# Patient Record
Sex: Male | Born: 1950
Health system: Southern US, Community
[De-identification: ages and names within clinical notes are randomized; demographics above are authoritative.]

## PROBLEM LIST (undated history)

## (undated) DIAGNOSIS — B1920 Unspecified viral hepatitis C without hepatic coma: Secondary | ICD-10-CM

## (undated) DIAGNOSIS — E119 Type 2 diabetes mellitus without complications: Secondary | ICD-10-CM

## (undated) DIAGNOSIS — K759 Inflammatory liver disease, unspecified: Secondary | ICD-10-CM

## (undated) DIAGNOSIS — I10 Essential (primary) hypertension: Secondary | ICD-10-CM

## (undated) HISTORY — DX: Essential (primary) hypertension: I10

## (undated) HISTORY — DX: Inflammatory liver disease, unspecified: K75.9

## (undated) HISTORY — DX: Unspecified viral hepatitis C without hepatic coma: B19.20

## (undated) HISTORY — PX: HAND SURGERY: SHX662

## (undated) HISTORY — DX: Type 2 diabetes mellitus without complications: E11.9

---

## 2010-01-01 ENCOUNTER — Inpatient Hospital Stay (HOSPITAL_COMMUNITY): Admission: EM | Admit: 2010-01-01 | Discharge: 2010-01-02 | Payer: Self-pay | Admitting: Emergency Medicine

## 2010-06-13 LAB — GLUCOSE, CAPILLARY
Glucose-Capillary: 104 mg/dL — ABNORMAL HIGH (ref 70–99)
Glucose-Capillary: 98 mg/dL (ref 70–99)

## 2010-06-13 LAB — CROSSMATCH

## 2010-06-13 LAB — POCT I-STAT 4, (NA,K, GLUC, HGB,HCT)
Glucose, Bld: 123 mg/dL — ABNORMAL HIGH (ref 70–99)
HCT: 22 % — ABNORMAL LOW (ref 39.0–52.0)
Hemoglobin: 7.5 g/dL — ABNORMAL LOW (ref 13.0–17.0)
Sodium: 139 mEq/L (ref 135–145)

## 2010-06-13 LAB — COMPREHENSIVE METABOLIC PANEL
ALT: 47 U/L (ref 0–53)
Albumin: 2.8 g/dL — ABNORMAL LOW (ref 3.5–5.2)
Alkaline Phosphatase: 41 U/L (ref 39–117)
Calcium: 8.1 mg/dL — ABNORMAL LOW (ref 8.4–10.5)
GFR calc Af Amer: >60 mL/min (ref >60)
Potassium: 3.9 mEq/L (ref 3.5–5.1)
Sodium: 136 mEq/L (ref 135–145)
Total Protein: 5.6 g/dL — ABNORMAL LOW (ref 6.0–8.3)

## 2010-06-13 LAB — BASIC METABOLIC PANEL
Chloride: 104 mEq/L (ref 96–112)
Creatinine, Ser: 1.05 mg/dL (ref 0.4–1.5)
GFR calc Af Amer: >60 mL/min (ref >60)
GFR calc non Af Amer: >60 mL/min (ref >60)
Potassium: 3.5 mEq/L (ref 3.5–5.1)

## 2010-06-13 LAB — CBC
HCT: 38.9 % — ABNORMAL LOW (ref 39.0–52.0)
MCV: 94.6 fL (ref 78.0–100.0)
Platelets: 211 10*3/uL (ref 150–400)
RBC: 4.11 MIL/uL — ABNORMAL LOW (ref 4.22–5.81)
WBC: 6.8 10*3/uL (ref 4.0–10.5)

## 2010-06-13 LAB — ABO/RH: ABO/RH(D): O POS

## 2010-06-13 LAB — DIFFERENTIAL
Eosinophils Relative: 2 % (ref 0–5)
Lymphocytes Relative: 42 % (ref 12–46)
Lymphs Abs: 2.9 10*3/uL (ref 0.7–4.0)
Neutro Abs: 3 10*3/uL (ref 1.7–7.7)

## 2010-06-13 LAB — HEMOGLOBIN AND HEMATOCRIT, BLOOD: HCT: 28 % — ABNORMAL LOW (ref 39.0–52.0)

## 2010-06-13 LAB — MRSA PCR SCREENING: MRSA by PCR: NEGATIVE

## 2011-02-17 ENCOUNTER — Encounter (INDEPENDENT_AMBULATORY_CARE_PROVIDER_SITE_OTHER): Payer: Self-pay | Admitting: Ophthalmology

## 2011-03-05 ENCOUNTER — Encounter (INDEPENDENT_AMBULATORY_CARE_PROVIDER_SITE_OTHER): Payer: PRIVATE HEALTH INSURANCE | Admitting: Ophthalmology

## 2011-03-05 DIAGNOSIS — I10 Essential (primary) hypertension: Secondary | ICD-10-CM

## 2011-03-05 DIAGNOSIS — H35039 Hypertensive retinopathy, unspecified eye: Secondary | ICD-10-CM

## 2011-03-05 DIAGNOSIS — E1139 Type 2 diabetes mellitus with other diabetic ophthalmic complication: Secondary | ICD-10-CM

## 2011-03-05 DIAGNOSIS — H3410 Central retinal artery occlusion, unspecified eye: Secondary | ICD-10-CM

## 2011-04-04 ENCOUNTER — Ambulatory Visit (INDEPENDENT_AMBULATORY_CARE_PROVIDER_SITE_OTHER): Payer: PRIVATE HEALTH INSURANCE | Admitting: Ophthalmology

## 2011-04-04 DIAGNOSIS — H35059 Retinal neovascularization, unspecified, unspecified eye: Secondary | ICD-10-CM

## 2011-04-15 ENCOUNTER — Other Ambulatory Visit: Payer: Self-pay | Admitting: Family Medicine

## 2011-04-15 DIAGNOSIS — H5461 Unqualified visual loss, right eye, normal vision left eye: Secondary | ICD-10-CM

## 2011-04-15 DIAGNOSIS — I10 Essential (primary) hypertension: Secondary | ICD-10-CM

## 2011-04-21 ENCOUNTER — Other Ambulatory Visit: Payer: PRIVATE HEALTH INSURANCE

## 2011-04-25 ENCOUNTER — Ambulatory Visit
Admission: RE | Admit: 2011-04-25 | Discharge: 2011-04-25 | Disposition: A | Discharge status: Home / Self Care | Payer: PRIVATE HEALTH INSURANCE | Source: Ambulatory Visit | Attending: Family Medicine | Admitting: Family Medicine

## 2011-04-25 DIAGNOSIS — H5461 Unqualified visual loss, right eye, normal vision left eye: Secondary | ICD-10-CM

## 2011-04-25 DIAGNOSIS — I10 Essential (primary) hypertension: Secondary | ICD-10-CM

## 2011-08-04 ENCOUNTER — Ambulatory Visit (INDEPENDENT_AMBULATORY_CARE_PROVIDER_SITE_OTHER): Payer: PRIVATE HEALTH INSURANCE | Admitting: Ophthalmology

## 2011-08-04 DIAGNOSIS — H35039 Hypertensive retinopathy, unspecified eye: Secondary | ICD-10-CM

## 2011-08-04 DIAGNOSIS — H3410 Central retinal artery occlusion, unspecified eye: Secondary | ICD-10-CM

## 2011-08-04 DIAGNOSIS — E11319 Type 2 diabetes mellitus with unspecified diabetic retinopathy without macular edema: Secondary | ICD-10-CM

## 2011-08-04 DIAGNOSIS — I10 Essential (primary) hypertension: Secondary | ICD-10-CM

## 2011-08-04 DIAGNOSIS — E1139 Type 2 diabetes mellitus with other diabetic ophthalmic complication: Secondary | ICD-10-CM

## 2011-08-04 DIAGNOSIS — H2510 Age-related nuclear cataract, unspecified eye: Secondary | ICD-10-CM

## 2011-08-04 DIAGNOSIS — H43819 Vitreous degeneration, unspecified eye: Secondary | ICD-10-CM

## 2012-02-04 ENCOUNTER — Ambulatory Visit (INDEPENDENT_AMBULATORY_CARE_PROVIDER_SITE_OTHER): Payer: PRIVATE HEALTH INSURANCE | Admitting: Ophthalmology

## 2012-03-17 ENCOUNTER — Ambulatory Visit (INDEPENDENT_AMBULATORY_CARE_PROVIDER_SITE_OTHER): Payer: PRIVATE HEALTH INSURANCE | Admitting: Ophthalmology

## 2012-03-17 DIAGNOSIS — H35039 Hypertensive retinopathy, unspecified eye: Secondary | ICD-10-CM

## 2012-03-17 DIAGNOSIS — E11319 Type 2 diabetes mellitus with unspecified diabetic retinopathy without macular edema: Secondary | ICD-10-CM

## 2012-03-17 DIAGNOSIS — H43819 Vitreous degeneration, unspecified eye: Secondary | ICD-10-CM

## 2012-03-17 DIAGNOSIS — I10 Essential (primary) hypertension: Secondary | ICD-10-CM

## 2012-03-17 DIAGNOSIS — H348192 Central retinal vein occlusion, unspecified eye, stable: Secondary | ICD-10-CM

## 2012-03-17 DIAGNOSIS — E1139 Type 2 diabetes mellitus with other diabetic ophthalmic complication: Secondary | ICD-10-CM

## 2012-03-17 DIAGNOSIS — H2510 Age-related nuclear cataract, unspecified eye: Secondary | ICD-10-CM

## 2012-12-24 ENCOUNTER — Ambulatory Visit (INDEPENDENT_AMBULATORY_CARE_PROVIDER_SITE_OTHER): Payer: PRIVATE HEALTH INSURANCE | Admitting: Ophthalmology

## 2013-01-07 ENCOUNTER — Ambulatory Visit (INDEPENDENT_AMBULATORY_CARE_PROVIDER_SITE_OTHER): Payer: PRIVATE HEALTH INSURANCE | Admitting: Ophthalmology

## 2013-01-07 DIAGNOSIS — H348192 Central retinal vein occlusion, unspecified eye, stable: Secondary | ICD-10-CM

## 2013-01-07 DIAGNOSIS — H43819 Vitreous degeneration, unspecified eye: Secondary | ICD-10-CM

## 2013-01-07 DIAGNOSIS — I10 Essential (primary) hypertension: Secondary | ICD-10-CM

## 2013-01-07 DIAGNOSIS — H35039 Hypertensive retinopathy, unspecified eye: Secondary | ICD-10-CM

## 2013-01-07 DIAGNOSIS — H2510 Age-related nuclear cataract, unspecified eye: Secondary | ICD-10-CM

## 2013-01-27 ENCOUNTER — Other Ambulatory Visit (INDEPENDENT_AMBULATORY_CARE_PROVIDER_SITE_OTHER): Payer: PRIVATE HEALTH INSURANCE | Admitting: Ophthalmology

## 2013-01-27 DIAGNOSIS — H348192 Central retinal vein occlusion, unspecified eye, stable: Secondary | ICD-10-CM

## 2013-01-27 DIAGNOSIS — I10 Essential (primary) hypertension: Secondary | ICD-10-CM

## 2013-01-27 DIAGNOSIS — H35039 Hypertensive retinopathy, unspecified eye: Secondary | ICD-10-CM

## 2013-06-10 ENCOUNTER — Ambulatory Visit (INDEPENDENT_AMBULATORY_CARE_PROVIDER_SITE_OTHER): Payer: PRIVATE HEALTH INSURANCE | Admitting: Ophthalmology

## 2013-06-10 DIAGNOSIS — H35039 Hypertensive retinopathy, unspecified eye: Secondary | ICD-10-CM

## 2013-06-10 DIAGNOSIS — H348192 Central retinal vein occlusion, unspecified eye, stable: Secondary | ICD-10-CM

## 2013-06-10 DIAGNOSIS — H43819 Vitreous degeneration, unspecified eye: Secondary | ICD-10-CM

## 2013-06-10 DIAGNOSIS — H2510 Age-related nuclear cataract, unspecified eye: Secondary | ICD-10-CM

## 2013-06-10 DIAGNOSIS — I10 Essential (primary) hypertension: Secondary | ICD-10-CM

## 2014-01-29 DIAGNOSIS — B1920 Unspecified viral hepatitis C without hepatic coma: Secondary | ICD-10-CM

## 2014-01-29 HISTORY — DX: Unspecified viral hepatitis C without hepatic coma: B19.20

## 2014-03-13 ENCOUNTER — Other Ambulatory Visit: Payer: Self-pay | Admitting: Nurse Practitioner

## 2014-03-13 DIAGNOSIS — R772 Abnormality of alphafetoprotein: Secondary | ICD-10-CM

## 2014-03-17 ENCOUNTER — Ambulatory Visit (INDEPENDENT_AMBULATORY_CARE_PROVIDER_SITE_OTHER): Payer: PRIVATE HEALTH INSURANCE | Admitting: Ophthalmology

## 2014-03-17 DIAGNOSIS — E11329 Type 2 diabetes mellitus with mild nonproliferative diabetic retinopathy without macular edema: Secondary | ICD-10-CM

## 2014-03-17 DIAGNOSIS — I10 Essential (primary) hypertension: Secondary | ICD-10-CM

## 2014-03-17 DIAGNOSIS — H43813 Vitreous degeneration, bilateral: Secondary | ICD-10-CM

## 2014-03-17 DIAGNOSIS — E11319 Type 2 diabetes mellitus with unspecified diabetic retinopathy without macular edema: Secondary | ICD-10-CM

## 2014-03-17 DIAGNOSIS — H35033 Hypertensive retinopathy, bilateral: Secondary | ICD-10-CM

## 2014-03-17 DIAGNOSIS — H34811 Central retinal vein occlusion, right eye: Secondary | ICD-10-CM

## 2014-03-20 ENCOUNTER — Encounter: Payer: Self-pay | Admitting: Gastroenterology

## 2014-04-04 ENCOUNTER — Ambulatory Visit
Admission: RE | Admit: 2014-04-04 | Discharge: 2014-04-04 | Disposition: A | Discharge status: Home / Self Care | Payer: PRIVATE HEALTH INSURANCE | Source: Ambulatory Visit | Attending: Nurse Practitioner | Admitting: Nurse Practitioner

## 2014-04-04 DIAGNOSIS — R772 Abnormality of alphafetoprotein: Secondary | ICD-10-CM

## 2014-04-04 DIAGNOSIS — B1920 Unspecified viral hepatitis C without hepatic coma: Secondary | ICD-10-CM | POA: Diagnosis not present

## 2014-05-02 DIAGNOSIS — B182 Chronic viral hepatitis C: Secondary | ICD-10-CM | POA: Diagnosis not present

## 2014-05-02 DIAGNOSIS — K7469 Other cirrhosis of liver: Secondary | ICD-10-CM | POA: Diagnosis not present

## 2014-05-08 ENCOUNTER — Encounter: Payer: Self-pay | Admitting: Gastroenterology

## 2014-05-08 ENCOUNTER — Ambulatory Visit (INDEPENDENT_AMBULATORY_CARE_PROVIDER_SITE_OTHER): Payer: PRIVATE HEALTH INSURANCE | Admitting: Gastroenterology

## 2014-05-08 VITALS — BP 128/80 | HR 84 | Ht 73.0 in | Wt 178.6 lb

## 2014-05-08 DIAGNOSIS — B182 Chronic viral hepatitis C: Secondary | ICD-10-CM

## 2014-05-08 DIAGNOSIS — Z1211 Encounter for screening for malignant neoplasm of colon: Secondary | ICD-10-CM

## 2014-05-08 MED ORDER — PEG-KCL-NACL-NASULF-NA ASC-C 100 G PO SOLR: 1.0000 | Freq: Once | ORAL | Status: DC

## 2014-05-08 NOTE — Progress Notes (Signed)
HPI: This is a  very pleasant 64 year old man whom I am meeting for the first time today. He is here with his wife today.  Currently week 6-7 of harvoni.  According to notes scanned into [facility], he was recently started on Harvoni for genotype 1a chronic Hep C.  Had a F4 fibrosure test result, that is very suspicious for underlying severe fibrosis, cirrhosis and so underwent US for hepatoma screening and he was recommended to have EGD to screen for varices.  03/2014 [place]: liver appeared normal, no morphologic changes of cirrhosis  Quit drinking etoh for 5 months now.    No overt gi bleeding.  Overall weight up a bit.  Never had colon cancer screening.    Review of systems: Pertinent positive and negative review of systems were noted in the above HPI section. Complete review of systems was performed and was otherwise normal.    Past Medical History  Diagnosis Date  . Hepatitis   . Hypertension   . Diabetes     Past Surgical History  Procedure Laterality Date  . Hand surgery      rt thumb    Current Outpatient Prescriptions  Medication Sig Dispense Refill  . amLODipine (NORVASC) 10 MG tablet Take 10 mg by mouth daily.  3  . aspirin 81 MG tablet Take 81 mg by mouth daily.    . fluticasone (CUTIVATE) 0.05 % cream   3  . losartan (COZAAR) 100 MG tablet Take 100 mg by mouth daily.  4  . metFORMIN (GLUCOPHAGE) 500 MG tablet Take 500 mg by mouth daily.  3  . Multiple Vitamin (MULTIVITAMIN) tablet Take 1 tablet by mouth daily.    . Omega-3 Fatty Acids (FISH OIL) 1000 MG CAPS Take 1 capsule by mouth 2 (two) times a week.    Marland Kitchen VASCEPA 1 G CAPS Take 1 capsule by mouth daily.  3   No current facility-administered medications for this visit.    Allergies as of 05/08/2014  . (No Known Allergies)    Family History  Problem Relation Age of Onset  . Diabetes Paternal Grandmother     History   Social History  . Marital Status: Married    Spouse Name: N/A    Number of  Children: 2  . Years of Education: N/A   Occupational History  . [employer]    Social History Main Topics  . Smoking status: Current Every Day Smoker    Types: Cigarettes  . Smokeless tobacco: Never Used  . Alcohol Use: No  . Drug Use: No  . Sexual Activity: Not on file   Other Topics Concern  . Not on file   Social History Narrative  . No narrative on file       Physical Exam: BP 128/80 mmHg  Pulse 84  Ht 6\' 1"  (1.854 m)  Wt 178 lb 9.6 oz (81.012 kg)  BMI 23.57 kg/m2 Constitutional: generally well-appearing Psychiatric: alert and oriented x3 Eyes: extraocular movements intact Mouth: oral pharynx moist, no lesions Neck: supple no lymphadenopathy Cardiovascular: heart regular rate and rhythm Lungs: clear to auscultation bilaterally Abdomen: soft, nontender, nondistended, no obvious ascites, no peritoneal signs, normal bowel sounds Extremities: no lower extremity edema bilaterally Skin: no lesions on visible extremities    Assessment and plan: 64 y.o. male with chronic hepatitis C, elevated fibrosis score suggesting underlying cirrhosis, routine risk for colon cancer  He has normal coags normal platelets and so I think it is unlikely that he has significant portal  hypertension however given his elevated fibrosis score he was referred by his hepatologist for EGD to screen for esophageal varices. I'm happy to do that for him. At the same time he will undergo colonoscopy for colon cancer screening since he has never had screening that he is aware of. I see no reason for any further blood tests or imaging studies prior to then.

## 2014-05-08 NOTE — Patient Instructions (Signed)
You will be set up for a colonoscopy for colon cancer screening. You will be set up for an upper endoscopy for esophageal varices screening.

## 2014-06-05 ENCOUNTER — Encounter: Payer: Self-pay | Admitting: Gastroenterology

## 2014-06-16 ENCOUNTER — Ambulatory Visit (AMBULATORY_SURGERY_CENTER): Payer: PRIVATE HEALTH INSURANCE | Admitting: Gastroenterology

## 2014-06-16 ENCOUNTER — Encounter: Payer: Self-pay | Admitting: Gastroenterology

## 2014-06-16 VITALS — BP 132/70 | HR 67 | Temp 97.2°F | Resp 21 | Ht 73.0 in | Wt 178.0 lb

## 2014-06-16 DIAGNOSIS — K2990 Gastroduodenitis, unspecified, without bleeding: Secondary | ICD-10-CM

## 2014-06-16 DIAGNOSIS — E119 Type 2 diabetes mellitus without complications: Secondary | ICD-10-CM | POA: Diagnosis not present

## 2014-06-16 DIAGNOSIS — B182 Chronic viral hepatitis C: Secondary | ICD-10-CM

## 2014-06-16 DIAGNOSIS — Z1211 Encounter for screening for malignant neoplasm of colon: Secondary | ICD-10-CM | POA: Diagnosis not present

## 2014-06-16 DIAGNOSIS — K2970 Gastritis, unspecified, without bleeding: Secondary | ICD-10-CM

## 2014-06-16 DIAGNOSIS — D125 Benign neoplasm of sigmoid colon: Secondary | ICD-10-CM

## 2014-06-16 DIAGNOSIS — B1920 Unspecified viral hepatitis C without hepatic coma: Secondary | ICD-10-CM | POA: Diagnosis not present

## 2014-06-16 DIAGNOSIS — I10 Essential (primary) hypertension: Secondary | ICD-10-CM | POA: Diagnosis not present

## 2014-06-16 LAB — GLUCOSE, CAPILLARY
GLUCOSE-CAPILLARY: 96 mg/dL (ref 70–99)
Glucose-Capillary: 93 mg/dL (ref 70–99)

## 2014-06-16 MED ORDER — SODIUM CHLORIDE 0.9 % IV SOLN: 500.0000 mL | INTRAVENOUS | Status: DC

## 2014-06-16 NOTE — Progress Notes (Signed)
Called to room to assist during endoscopic procedure.  Patient ID and intended procedure confirmed with present staff. Received instructions for my participation in the procedure from the performing physician.  

## 2014-06-16 NOTE — Op Note (Signed)
[facility]  [address]. [place], [id number]   COLONOSCOPY PROCEDURE REPORT  PATIENT: Charles Saunders, Charles Saunders  MR#: [id number] BIRTHDATE: 05/11/1950 , 79  yrs. old GENDER: male ENDOSCOPIST: Milus Banister, MD REFERRED BY: Roosevelt Locks, NP PROCEDURE DATE:  06/16/2014 PROCEDURE:   Colonoscopy, screening and Colonoscopy with snare polypectomy First Screening Colonoscopy - Avg.  risk and is 50 yrs.  old or older Yes.  Prior Negative Screening - Now for repeat screening. N/A  History of Adenoma - Now for follow-up colonoscopy & has been > or = to 3 yrs.  N/A ASA CLASS:   Class III INDICATIONS:Screening for colonic neoplasia and Colorectal Neoplasm Risk Assessment for this procedure is average risk. MEDICATIONS: Monitored anesthesia care and Propofol 200 mg IV  DESCRIPTION OF PROCEDURE:   After the risks benefits and alternatives of the procedure were thoroughly explained, informed consent was obtained.  The digital rectal exam revealed no abnormalities of the rectum.   The LB [id number]  endoscope was introduced through the anus and advanced to the cecum, which was identified by both the appendix and ileocecal valve. No adverse events experienced.   The quality of the prep was excellent.  The instrument was then slowly withdrawn as the colon was fully examined.   COLON FINDINGS: A sessile polyp measuring 4 mm in size was found in the sigmoid colon.  A polypectomy was performed with a cold snare. The resection was complete, the polyp tissue was completely retrieved and sent to histology.   There was mild diverticulosis noted in the left colon.   The examination was otherwise normal. Retroflexed views revealed no abnormalities. The time to cecum = 2.1 Withdrawal time = 7.9   The scope was withdrawn and the procedure completed. COMPLICATIONS: There were no immediate complications.  ENDOSCOPIC IMPRESSION: 1.   Sessile polyp was found in the sigmoid colon;  polypectomy was performed with a cold snare 2.   Mild diverticulosis was noted in the left colon 3.   The examination was otherwise normal  RECOMMENDATIONS: If the polyp(s) removed today are proven to be adenomatous (pre-cancerous) polyps, you will need a repeat colonoscopy in 5 years.  Otherwise you should continue to follow colorectal cancer screening guidelines for "routine risk" patients with colonoscopy in 10 years.  You will receive a letter within 1-2 weeks with the results of your biopsy as well as final recommendations.  Please call my office if you have not received a letter after 3 weeks.  eSigned:  Milus Banister, MD 06/16/2014 3:13 PM

## 2014-06-16 NOTE — Progress Notes (Signed)
Report to PACU, RN, vss, BBS= Clear.  

## 2014-06-16 NOTE — Op Note (Signed)
[facility]  [address]. [place], [id number]   ENDOSCOPY PROCEDURE REPORT  PATIENT: Charles Saunders, Charles Saunders  MR#: [id number] BIRTHDATE: 05/22/1950 , 42  yrs. old GENDER: male ENDOSCOPIST: Milus Banister, MD PROCEDURE DATE:  06/16/2014 PROCEDURE:  EGD w/ biopsy ASA CLASS:     Class III INDICATIONS:  chronic liver disease, recent fibrosure test suggesting underlying fibrosis/cirrhosis; screening for varices. MEDICATIONS: Monitored anesthesia care and Propofol 100 mg IV TOPICAL ANESTHETIC: none  DESCRIPTION OF PROCEDURE: After the risks benefits and alternatives of the procedure were thoroughly explained, informed consent was obtained.  The LB [id number] endoscope was introduced through the mouth and advanced to the second portion of the duodenum , Without limitations.  The instrument was slowly withdrawn as the mucosa was fully examined.  There was mild to moderate, non-specific pangastritis.  The distal stomach was biopsied and sent to patology.  There were no changes to suggest portal hypertension (no esophageal or gastric varices, no portal hypertensive appearing gastropathy).  The examination was otherwise normal.  Retroflexed views revealed no abnormalities. The scope was then withdrawn from the patient and the procedure completed.  COMPLICATIONS: There were no immediate complications.  ENDOSCOPIC IMPRESSION: There was mild to moderate, non-specific pangastritis.  The distal stomach was biopsied and sent to patology.  There were no changes to suggest portal hypertension (no esophageal or gastric varices, no portal hypertensive appearing gastropathy).  The examination was otherwise normal  RECOMMENDATIONS: Await final pathology results.   eSigned:  Milus Banister, MD 06/16/2014 3:17 PM    CC: Roosevelt Locks, NP

## 2014-06-16 NOTE — Patient Instructions (Addendum)
One of your biggest health concerns is your smoking.  This increases your risk for most cancers and serious cardiovascular diseases such as strokes, heart attacks.  You should try your best to stop.  If you need assistance, please contact your PCP or Smoking Cessation Class at [facility] [phone]) or [place] (1-800-QUIT-NOW).   YOU HAD AN ENDOSCOPIC PROCEDURE TODAY AT [facility]:   Refer to the procedure report that was given to you for any specific questions about what was found during the examination.  If the procedure report does not answer your questions, please call your gastroenterologist to clarify.  If you requested that your care partner not be given the details of your procedure findings, then the procedure report has been included in a sealed envelope for you to review at your convenience later.  YOU SHOULD EXPECT: Some feelings of bloating in the abdomen. Passage of more gas than usual.  Walking can help get rid of the air that was put into your GI tract during the procedure and reduce the bloating. If you had a lower endoscopy (such as a colonoscopy or flexible sigmoidoscopy) you may notice spotting of blood in your stool or on the toilet paper. If you underwent a bowel prep for your procedure, you may not have a normal bowel movement for a few days.  Please Note:  You might notice some irritation and congestion in your nose or some drainage.  This is from the oxygen used during your procedure.  There is no need for concern and it should clear up in a day or so.  SYMPTOMS TO REPORT IMMEDIATELY:   Following lower endoscopy (colonoscopy or flexible sigmoidoscopy):  Excessive amounts of blood in the stool  Significant tenderness or worsening of abdominal pains  Swelling of the abdomen that is new, acute  Fever of 100F or higher   Following upper endoscopy (EGD)  Vomiting of blood or coffee ground material  New chest pain or pain under the  shoulder blades  Painful or persistently difficult swallowing  New shortness of breath  Fever of 100F or higher  Black, tarry-looking stools  For urgent or emergent issues, a gastroenterologist can be reached at any hour by calling [phone].   DIET: Your first meal following the procedure should be a small meal and then it is ok to progress to your normal diet. Heavy or fried foods are harder to digest and may make you feel nauseous or bloated.  Likewise, meals heavy in dairy and vegetables can increase bloating.  Drink plenty of fluids but you should avoid alcoholic beverages for 24 hours.  ACTIVITY:  You should plan to take it easy for the rest of today and you should NOT DRIVE or use heavy machinery until tomorrow (because of the sedation medicines used during the test).    FOLLOW UP: Our staff will call the number listed on your records the next business day following your procedure to check on you and address any questions or concerns that you may have regarding the information given to you following your procedure. If we do not reach you, we will leave a message.  However, if you are feeling well and you are not experiencing any problems, there is no need to return our call.  We will assume that you have returned to your regular daily activities without incident.  If any biopsies were taken you will be contacted by phone or by letter within the next 1-3 weeks.  Please  call us at [phone] if you have not heard about the biopsies in 3 weeks.    SIGNATURES/CONFIDENTIALITY: You and/or your care partner have signed paperwork which will be entered into your electronic medical record.  These signatures attest to the fact that that the information above on your After Visit Summary has been reviewed and is understood.  Full responsibility of the confidentiality of this discharge information lies with you and/or your care-partner.  Await pathology  Please read over handouts about  gastritis, high fiber diets, diverticulosis and polyps  Continue your normal medications

## 2014-06-19 ENCOUNTER — Encounter: Payer: Self-pay | Admitting: *Deleted

## 2014-06-19 ENCOUNTER — Telehealth: Payer: Self-pay | Admitting: *Deleted

## 2014-06-19 NOTE — Telephone Encounter (Signed)
  Follow up Call-  Call back number 06/16/2014  Post procedure Call Back phone  # [phone] or [phone]  Permission to leave phone message Yes      no answer, left message.

## 2014-06-21 DIAGNOSIS — B182 Chronic viral hepatitis C: Secondary | ICD-10-CM | POA: Diagnosis not present

## 2014-06-21 DIAGNOSIS — K7469 Other cirrhosis of liver: Secondary | ICD-10-CM | POA: Diagnosis not present

## 2014-07-03 ENCOUNTER — Encounter: Payer: Self-pay | Admitting: Gastroenterology

## 2014-07-06 DIAGNOSIS — E0800 Diabetes mellitus due to underlying condition with hyperosmolarity without nonketotic hyperglycemic-hyperosmolar coma (NKHHC): Secondary | ICD-10-CM | POA: Diagnosis not present

## 2014-07-06 DIAGNOSIS — I10 Essential (primary) hypertension: Secondary | ICD-10-CM | POA: Diagnosis not present

## 2014-07-06 DIAGNOSIS — L2089 Other atopic dermatitis: Secondary | ICD-10-CM | POA: Diagnosis not present

## 2014-07-06 DIAGNOSIS — F5221 Male erectile disorder: Secondary | ICD-10-CM | POA: Diagnosis not present

## 2014-07-11 ENCOUNTER — Telehealth: Payer: Self-pay

## 2014-07-11 NOTE — Telephone Encounter (Signed)
Left message on machine to call back  

## 2014-07-11 NOTE — Telephone Encounter (Signed)
-----   Message from Milus Banister, MD sent at 07/09/2014  7:06 AM EDT ----- Chong Sicilian, Can you call him and let him know that he will be getting a letter about the colonoscopy biopsy and that i forgot to mention the stomach biopsy on that letter.  The stomach biopsy shows no sign of infection or cancer.  Thanks  Santiago Glad, thanks for reminder.  dj  ----- Message -----    From: Kimber Relic, RN    Sent: 07/07/2014   2:26 PM      To: Milus Banister, MD  Dr. Ardis Hughs, This patient had a double procedure, no mention of EGD results on his letter. Thanks, Santiago Glad

## 2014-07-11 NOTE — Telephone Encounter (Signed)
The patient has been notified of this information and all questions answered.

## 2014-12-07 ENCOUNTER — Other Ambulatory Visit: Payer: Self-pay | Admitting: Nurse Practitioner

## 2014-12-07 DIAGNOSIS — C220 Liver cell carcinoma: Secondary | ICD-10-CM

## 2014-12-07 DIAGNOSIS — K7460 Unspecified cirrhosis of liver: Secondary | ICD-10-CM

## 2014-12-18 ENCOUNTER — Ambulatory Visit
Admission: RE | Admit: 2014-12-18 | Discharge: 2014-12-18 | Disposition: A | Discharge status: Home / Self Care | Payer: Medicare HMO | Source: Ambulatory Visit | Attending: Nurse Practitioner | Admitting: Nurse Practitioner

## 2014-12-18 ENCOUNTER — Ambulatory Visit (INDEPENDENT_AMBULATORY_CARE_PROVIDER_SITE_OTHER): Payer: PRIVATE HEALTH INSURANCE | Admitting: Ophthalmology

## 2014-12-18 DIAGNOSIS — K7460 Unspecified cirrhosis of liver: Secondary | ICD-10-CM

## 2014-12-18 DIAGNOSIS — C220 Liver cell carcinoma: Secondary | ICD-10-CM

## 2015-01-03 ENCOUNTER — Ambulatory Visit (INDEPENDENT_AMBULATORY_CARE_PROVIDER_SITE_OTHER): Payer: Medicare HMO | Admitting: Ophthalmology

## 2015-01-03 DIAGNOSIS — H348111 Central retinal vein occlusion, right eye, with retinal neovascularization: Secondary | ICD-10-CM

## 2015-01-03 DIAGNOSIS — E113292 Type 2 diabetes mellitus with mild nonproliferative diabetic retinopathy without macular edema, left eye: Secondary | ICD-10-CM

## 2015-01-03 DIAGNOSIS — E11311 Type 2 diabetes mellitus with unspecified diabetic retinopathy with macular edema: Secondary | ICD-10-CM

## 2015-01-03 DIAGNOSIS — I10 Essential (primary) hypertension: Secondary | ICD-10-CM

## 2015-01-03 DIAGNOSIS — H33021 Retinal detachment with multiple breaks, right eye: Secondary | ICD-10-CM | POA: Diagnosis not present

## 2015-01-03 DIAGNOSIS — H43813 Vitreous degeneration, bilateral: Secondary | ICD-10-CM

## 2015-01-03 DIAGNOSIS — H2513 Age-related nuclear cataract, bilateral: Secondary | ICD-10-CM

## 2015-01-03 DIAGNOSIS — H35033 Hypertensive retinopathy, bilateral: Secondary | ICD-10-CM

## 2015-01-18 ENCOUNTER — Ambulatory Visit (INDEPENDENT_AMBULATORY_CARE_PROVIDER_SITE_OTHER): Payer: Medicare HMO | Admitting: Ophthalmology

## 2015-01-18 DIAGNOSIS — H33301 Unspecified retinal break, right eye: Secondary | ICD-10-CM

## 2015-05-21 ENCOUNTER — Ambulatory Visit (INDEPENDENT_AMBULATORY_CARE_PROVIDER_SITE_OTHER): Payer: Medicare HMO | Admitting: Ophthalmology

## 2015-05-23 ENCOUNTER — Ambulatory Visit (INDEPENDENT_AMBULATORY_CARE_PROVIDER_SITE_OTHER): Payer: Medicare HMO | Admitting: Ophthalmology

## 2015-06-06 ENCOUNTER — Ambulatory Visit (INDEPENDENT_AMBULATORY_CARE_PROVIDER_SITE_OTHER): Payer: Commercial Managed Care - HMO | Admitting: Ophthalmology

## 2015-06-06 DIAGNOSIS — I10 Essential (primary) hypertension: Secondary | ICD-10-CM | POA: Diagnosis not present

## 2015-06-06 DIAGNOSIS — H348112 Central retinal vein occlusion, right eye, stable: Secondary | ICD-10-CM | POA: Diagnosis not present

## 2015-06-06 DIAGNOSIS — H33301 Unspecified retinal break, right eye: Secondary | ICD-10-CM | POA: Diagnosis not present

## 2015-06-06 DIAGNOSIS — H43813 Vitreous degeneration, bilateral: Secondary | ICD-10-CM

## 2015-06-06 DIAGNOSIS — H35033 Hypertensive retinopathy, bilateral: Secondary | ICD-10-CM

## 2015-07-10 DIAGNOSIS — I10 Essential (primary) hypertension: Secondary | ICD-10-CM | POA: Diagnosis not present

## 2015-07-10 DIAGNOSIS — E782 Mixed hyperlipidemia: Secondary | ICD-10-CM | POA: Diagnosis not present

## 2015-07-10 DIAGNOSIS — E0800 Diabetes mellitus due to underlying condition with hyperosmolarity without nonketotic hyperglycemic-hyperosmolar coma (NKHHC): Secondary | ICD-10-CM | POA: Diagnosis not present

## 2015-07-10 DIAGNOSIS — B182 Chronic viral hepatitis C: Secondary | ICD-10-CM | POA: Diagnosis not present

## 2015-07-12 DIAGNOSIS — E089 Diabetes mellitus due to underlying condition without complications: Secondary | ICD-10-CM | POA: Diagnosis not present

## 2015-07-12 DIAGNOSIS — F1021 Alcohol dependence, in remission: Secondary | ICD-10-CM | POA: Diagnosis not present

## 2015-07-12 DIAGNOSIS — I10 Essential (primary) hypertension: Secondary | ICD-10-CM | POA: Diagnosis not present

## 2015-08-08 ENCOUNTER — Other Ambulatory Visit: Payer: Self-pay | Admitting: Nurse Practitioner

## 2015-08-08 DIAGNOSIS — K7469 Other cirrhosis of liver: Secondary | ICD-10-CM | POA: Diagnosis not present

## 2015-08-09 DIAGNOSIS — K7469 Other cirrhosis of liver: Secondary | ICD-10-CM | POA: Diagnosis not present

## 2015-08-14 DIAGNOSIS — E089 Diabetes mellitus due to underlying condition without complications: Secondary | ICD-10-CM | POA: Diagnosis not present

## 2015-08-14 DIAGNOSIS — F5221 Male erectile disorder: Secondary | ICD-10-CM | POA: Diagnosis not present

## 2015-08-14 DIAGNOSIS — I10 Essential (primary) hypertension: Secondary | ICD-10-CM | POA: Diagnosis not present

## 2015-08-17 ENCOUNTER — Other Ambulatory Visit: Payer: Medicare HMO

## 2015-08-24 ENCOUNTER — Ambulatory Visit
Admission: RE | Admit: 2015-08-24 | Discharge: 2015-08-24 | Disposition: A | Discharge status: Home / Self Care | Payer: Commercial Managed Care - HMO | Source: Ambulatory Visit | Attending: Nurse Practitioner | Admitting: Nurse Practitioner

## 2015-08-24 DIAGNOSIS — Z1389 Encounter for screening for other disorder: Secondary | ICD-10-CM | POA: Diagnosis not present

## 2015-08-24 DIAGNOSIS — K7469 Other cirrhosis of liver: Secondary | ICD-10-CM

## 2015-10-02 IMAGING — US US ABDOMEN LIMITED
1 series · 14 of 25 positions shown · non-contrast
Comparison: 04/14/2014.

CLINICAL DATA: Hepatoma screening.

EXAM:
US ABDOMEN LIMITED - RIGHT UPPER QUADRANT

[Series 1: us abdomen limited · 0.26mm/px · 14 of 49 slices shown]
[im 1/49]
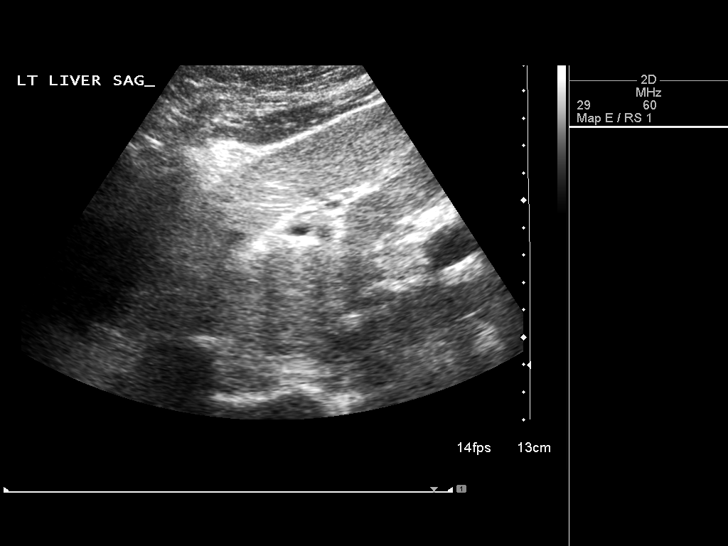
[im 5/49]
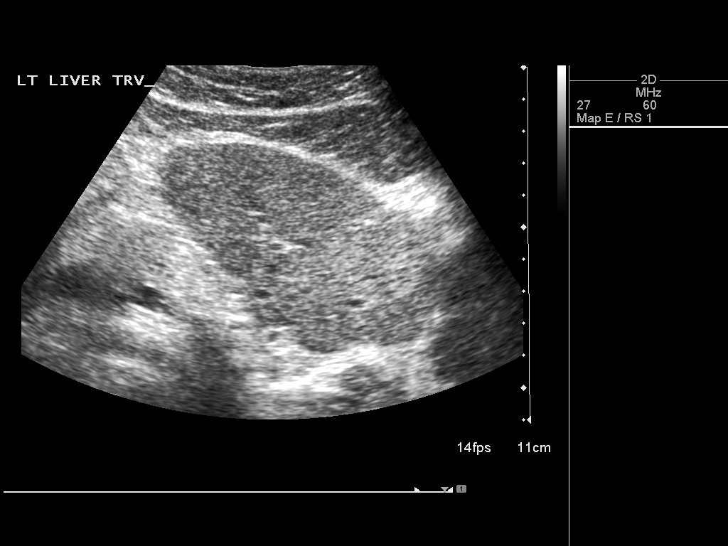
[im 9/49]
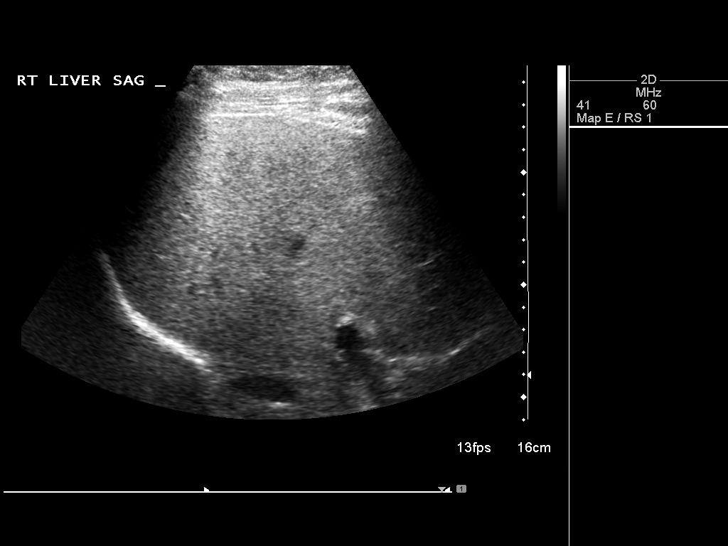
[im 13/49]
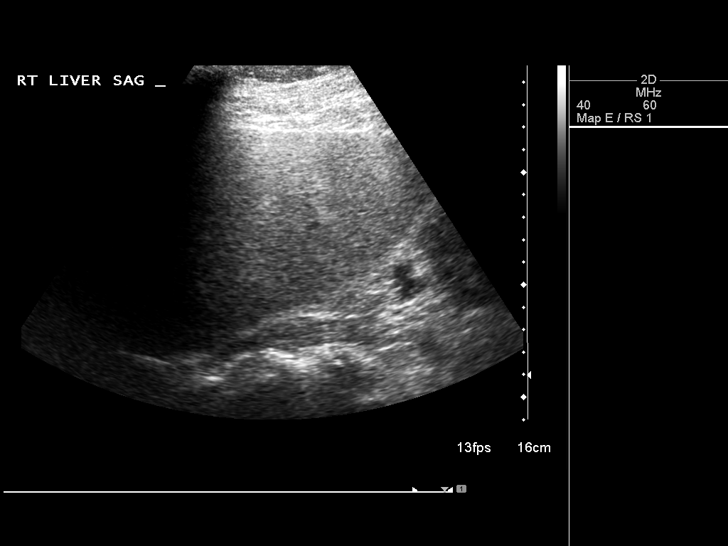
[im 17/49]
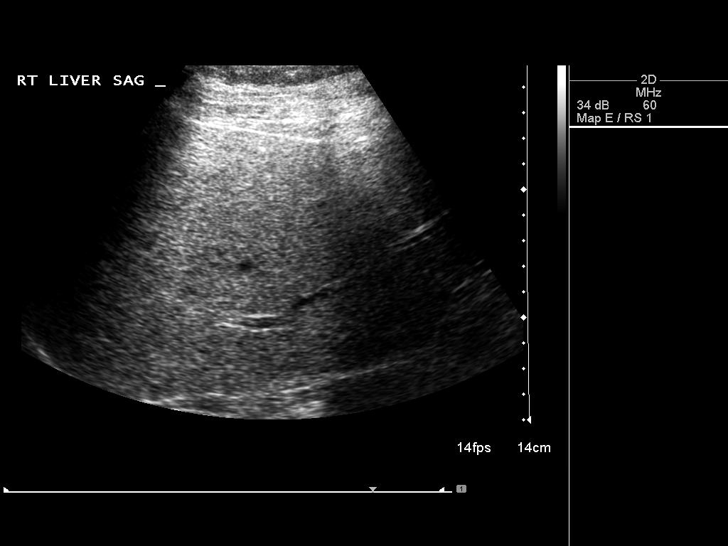
[im 19/49]
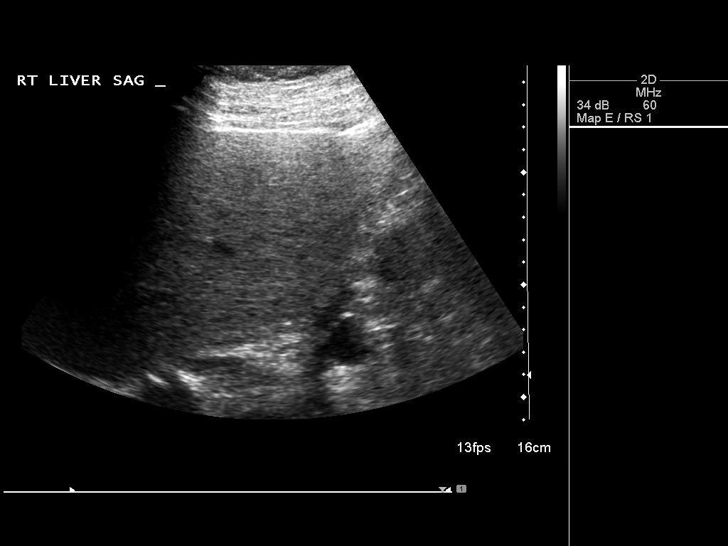
[im 23/49]
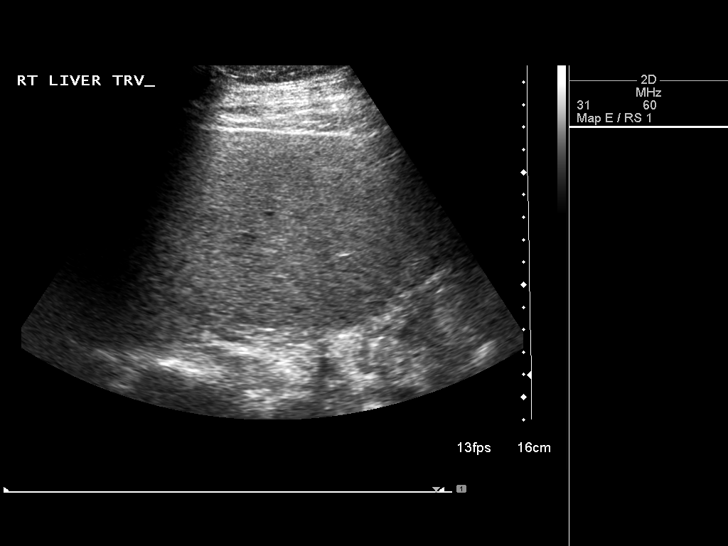
[im 27/49]
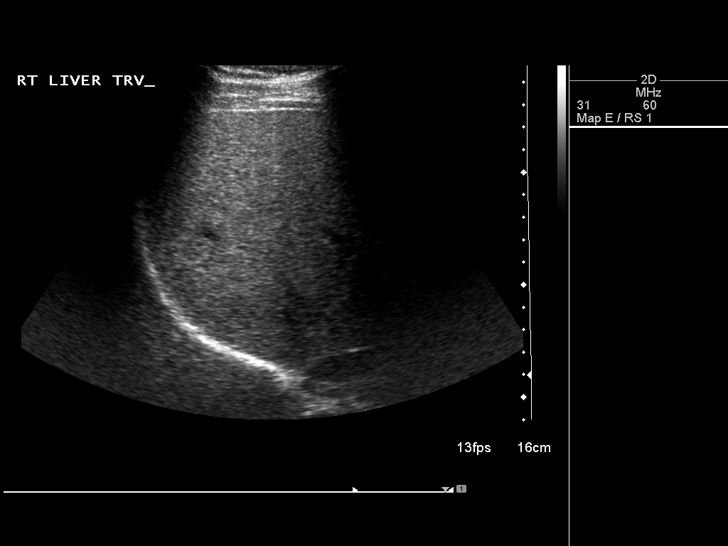
[im 31/49]
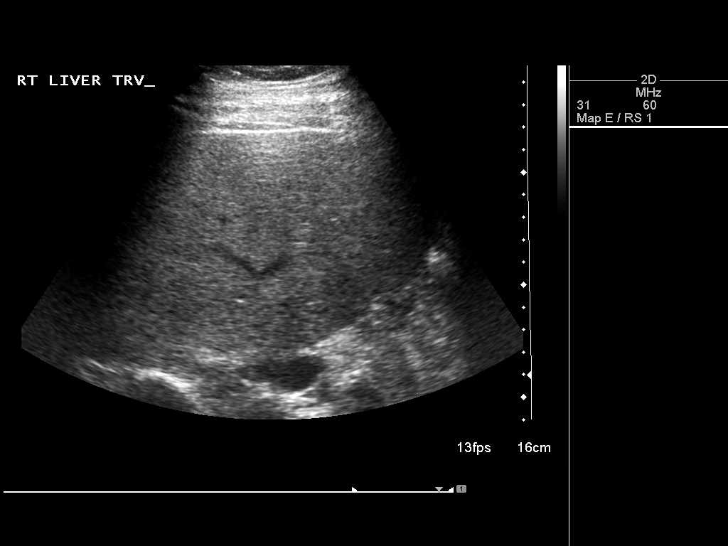
[im 33/49]
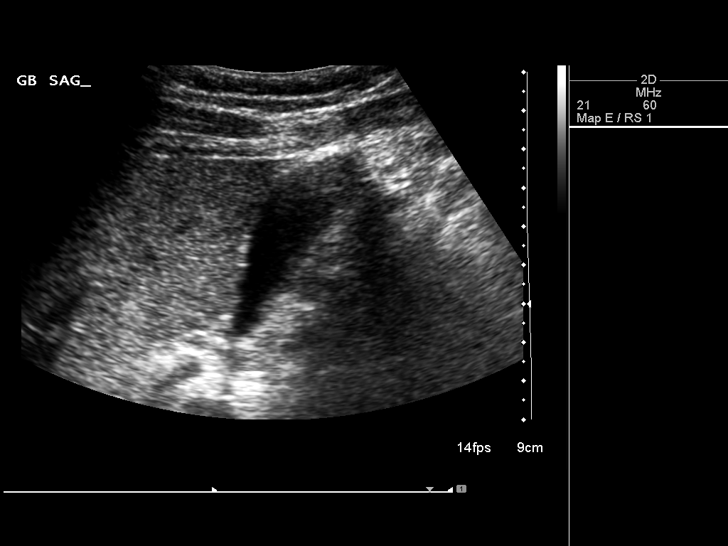
[im 37/49]
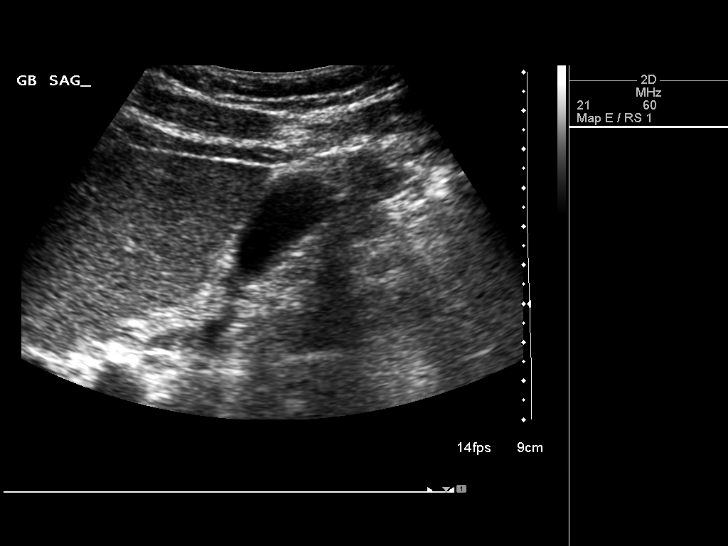
[im 41/49]
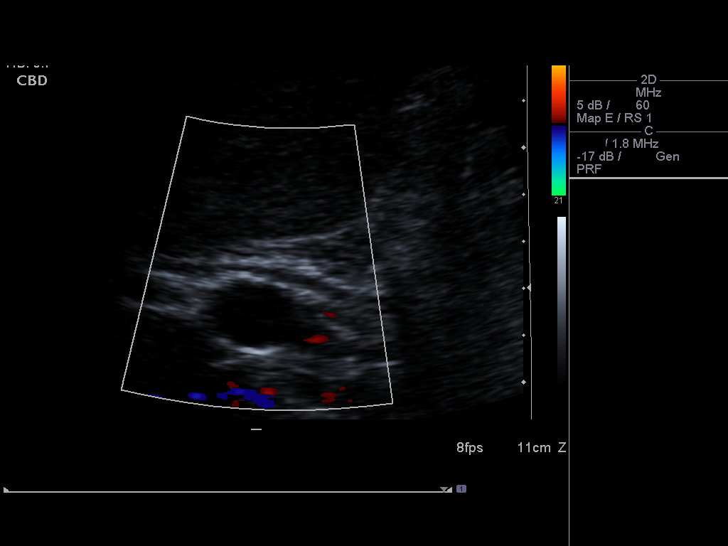
[im 45/49]
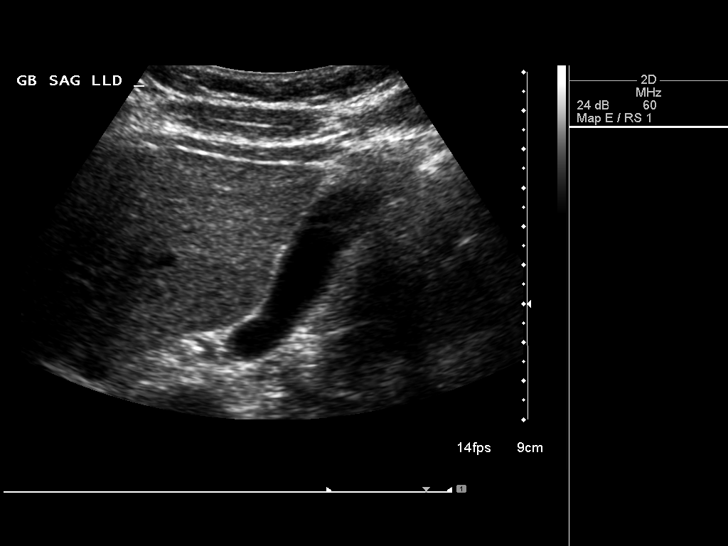
[im 49/49]
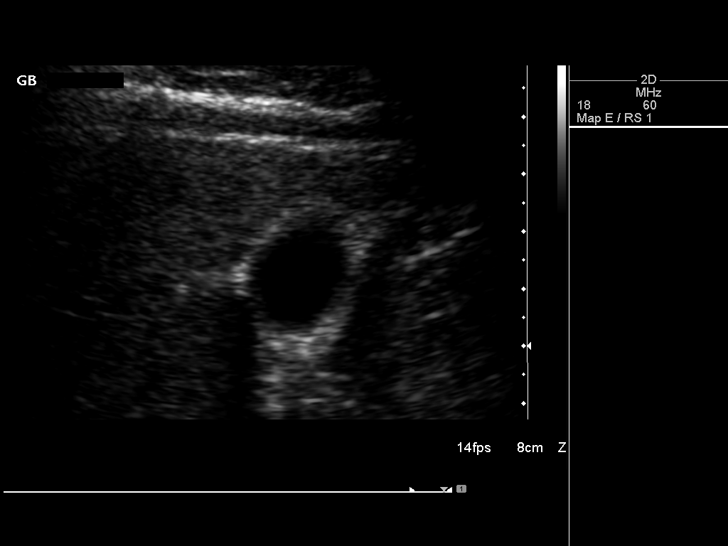

[14 of 25 positions shown; findings below may reference images not displayed]

FINDINGS: Gallbladder:

No gallstones. Gallbladder wall thickness 2.5 mm. Negative Murphy
sign. No pericholecystic fluid collection.

Common bile duct:

Diameter: 4.0 mm

Liver:

The liver is echogenic suggesting fatty infiltration and/or
hepatocellular disease. No focal hepatic abnormality.
IMPRESSION: Liver is echogenic suggesting fatty infiltration and/or
hepatocellular disease. No focal hepatic abnormality identified.

## 2015-12-10 DIAGNOSIS — E089 Diabetes mellitus due to underlying condition without complications: Secondary | ICD-10-CM | POA: Diagnosis not present

## 2015-12-10 DIAGNOSIS — Z79899 Other long term (current) drug therapy: Secondary | ICD-10-CM | POA: Diagnosis not present

## 2015-12-10 DIAGNOSIS — F5221 Male erectile disorder: Secondary | ICD-10-CM | POA: Diagnosis not present

## 2015-12-10 DIAGNOSIS — I10 Essential (primary) hypertension: Secondary | ICD-10-CM | POA: Diagnosis not present

## 2015-12-10 DIAGNOSIS — Z125 Encounter for screening for malignant neoplasm of prostate: Secondary | ICD-10-CM | POA: Diagnosis not present

## 2015-12-12 DIAGNOSIS — F5221 Male erectile disorder: Secondary | ICD-10-CM | POA: Diagnosis not present

## 2015-12-12 DIAGNOSIS — E1021 Type 1 diabetes mellitus with diabetic nephropathy: Secondary | ICD-10-CM | POA: Diagnosis not present

## 2015-12-12 DIAGNOSIS — I10 Essential (primary) hypertension: Secondary | ICD-10-CM | POA: Diagnosis not present

## 2015-12-12 DIAGNOSIS — E089 Diabetes mellitus due to underlying condition without complications: Secondary | ICD-10-CM | POA: Diagnosis not present

## 2016-02-06 ENCOUNTER — Other Ambulatory Visit: Payer: Self-pay | Admitting: Nurse Practitioner

## 2016-02-06 DIAGNOSIS — K7469 Other cirrhosis of liver: Secondary | ICD-10-CM

## 2016-02-06 DIAGNOSIS — K760 Fatty (change of) liver, not elsewhere classified: Secondary | ICD-10-CM | POA: Diagnosis not present

## 2016-02-14 ENCOUNTER — Other Ambulatory Visit: Payer: Commercial Managed Care - HMO

## 2016-02-28 ENCOUNTER — Ambulatory Visit
Admission: RE | Admit: 2016-02-28 | Discharge: 2016-02-28 | Disposition: A | Discharge status: Home / Self Care | Payer: Commercial Managed Care - HMO | Source: Ambulatory Visit | Attending: Nurse Practitioner | Admitting: Nurse Practitioner

## 2016-02-28 DIAGNOSIS — B182 Chronic viral hepatitis C: Secondary | ICD-10-CM | POA: Diagnosis not present

## 2016-02-28 DIAGNOSIS — K7469 Other cirrhosis of liver: Secondary | ICD-10-CM

## 2016-03-07 ENCOUNTER — Ambulatory Visit (INDEPENDENT_AMBULATORY_CARE_PROVIDER_SITE_OTHER): Payer: Commercial Managed Care - HMO | Admitting: Ophthalmology

## 2016-03-13 ENCOUNTER — Ambulatory Visit (INDEPENDENT_AMBULATORY_CARE_PROVIDER_SITE_OTHER): Payer: Commercial Managed Care - HMO | Admitting: Ophthalmology

## 2016-03-13 DIAGNOSIS — H43813 Vitreous degeneration, bilateral: Secondary | ICD-10-CM | POA: Diagnosis not present

## 2016-03-13 DIAGNOSIS — H34811 Central retinal vein occlusion, right eye, with macular edema: Secondary | ICD-10-CM | POA: Diagnosis not present

## 2016-03-13 DIAGNOSIS — I10 Essential (primary) hypertension: Secondary | ICD-10-CM | POA: Diagnosis not present

## 2016-03-13 DIAGNOSIS — H35033 Hypertensive retinopathy, bilateral: Secondary | ICD-10-CM | POA: Diagnosis not present

## 2016-04-08 DIAGNOSIS — F5221 Male erectile disorder: Secondary | ICD-10-CM | POA: Diagnosis not present

## 2016-04-08 DIAGNOSIS — I10 Essential (primary) hypertension: Secondary | ICD-10-CM | POA: Diagnosis not present

## 2016-04-08 DIAGNOSIS — E119 Type 2 diabetes mellitus without complications: Secondary | ICD-10-CM | POA: Diagnosis not present

## 2016-04-08 DIAGNOSIS — E1021 Type 1 diabetes mellitus with diabetic nephropathy: Secondary | ICD-10-CM | POA: Diagnosis not present

## 2016-04-10 DIAGNOSIS — E782 Mixed hyperlipidemia: Secondary | ICD-10-CM | POA: Diagnosis not present

## 2016-04-10 DIAGNOSIS — L102 Pemphigus foliaceous: Secondary | ICD-10-CM | POA: Diagnosis not present

## 2016-04-10 DIAGNOSIS — Z23 Encounter for immunization: Secondary | ICD-10-CM | POA: Diagnosis not present

## 2016-04-10 DIAGNOSIS — I10 Essential (primary) hypertension: Secondary | ICD-10-CM | POA: Diagnosis not present

## 2016-07-11 DIAGNOSIS — E782 Mixed hyperlipidemia: Secondary | ICD-10-CM | POA: Diagnosis not present

## 2016-07-11 DIAGNOSIS — I10 Essential (primary) hypertension: Secondary | ICD-10-CM | POA: Diagnosis not present

## 2016-07-11 DIAGNOSIS — E1021 Type 1 diabetes mellitus with diabetic nephropathy: Secondary | ICD-10-CM | POA: Diagnosis not present

## 2016-07-15 DIAGNOSIS — I10 Essential (primary) hypertension: Secondary | ICD-10-CM | POA: Diagnosis not present

## 2016-07-15 DIAGNOSIS — E089 Diabetes mellitus due to underlying condition without complications: Secondary | ICD-10-CM | POA: Diagnosis not present

## 2016-07-15 DIAGNOSIS — F5221 Male erectile disorder: Secondary | ICD-10-CM | POA: Diagnosis not present

## 2016-07-15 DIAGNOSIS — E782 Mixed hyperlipidemia: Secondary | ICD-10-CM | POA: Diagnosis not present

## 2016-08-14 ENCOUNTER — Other Ambulatory Visit: Payer: Self-pay | Admitting: Nurse Practitioner

## 2016-08-14 DIAGNOSIS — K760 Fatty (change of) liver, not elsewhere classified: Secondary | ICD-10-CM | POA: Diagnosis not present

## 2016-08-14 DIAGNOSIS — K7469 Other cirrhosis of liver: Secondary | ICD-10-CM | POA: Diagnosis not present

## 2016-08-27 ENCOUNTER — Ambulatory Visit
Admission: RE | Admit: 2016-08-27 | Discharge: 2016-08-27 | Disposition: A | Discharge status: Home / Self Care | Payer: Commercial Managed Care - HMO | Source: Ambulatory Visit | Attending: Nurse Practitioner | Admitting: Nurse Practitioner

## 2016-08-27 DIAGNOSIS — K7469 Other cirrhosis of liver: Secondary | ICD-10-CM

## 2016-09-03 ENCOUNTER — Ambulatory Visit
Admission: RE | Admit: 2016-09-03 | Discharge: 2016-09-03 | Disposition: A | Discharge status: Home / Self Care | Payer: Commercial Managed Care - HMO | Source: Ambulatory Visit | Attending: Nurse Practitioner | Admitting: Nurse Practitioner

## 2016-09-03 DIAGNOSIS — K7460 Unspecified cirrhosis of liver: Secondary | ICD-10-CM | POA: Diagnosis not present

## 2016-11-10 DIAGNOSIS — E782 Mixed hyperlipidemia: Secondary | ICD-10-CM | POA: Diagnosis not present

## 2016-11-10 DIAGNOSIS — I10 Essential (primary) hypertension: Secondary | ICD-10-CM | POA: Diagnosis not present

## 2016-11-10 DIAGNOSIS — F5221 Male erectile disorder: Secondary | ICD-10-CM | POA: Diagnosis not present

## 2016-11-10 DIAGNOSIS — E119 Type 2 diabetes mellitus without complications: Secondary | ICD-10-CM | POA: Diagnosis not present

## 2016-11-12 DIAGNOSIS — E782 Mixed hyperlipidemia: Secondary | ICD-10-CM | POA: Diagnosis not present

## 2016-11-12 DIAGNOSIS — E089 Diabetes mellitus due to underlying condition without complications: Secondary | ICD-10-CM | POA: Diagnosis not present

## 2016-11-12 DIAGNOSIS — N5201 Erectile dysfunction due to arterial insufficiency: Secondary | ICD-10-CM | POA: Diagnosis not present

## 2016-11-12 DIAGNOSIS — I10 Essential (primary) hypertension: Secondary | ICD-10-CM | POA: Diagnosis not present

## 2016-12-12 IMAGING — US US ABDOMEN LIMITED
1 series · 14 of 25 positions shown · non-contrast
Comparison: None.

CLINICAL DATA: Hepatitis-C

EXAM:
US ABDOMEN LIMITED - RIGHT UPPER QUADRANT

[Series 1: us abdomen limited · 0.24mm/px · 14 of 49 slices shown]
[im 1/49]
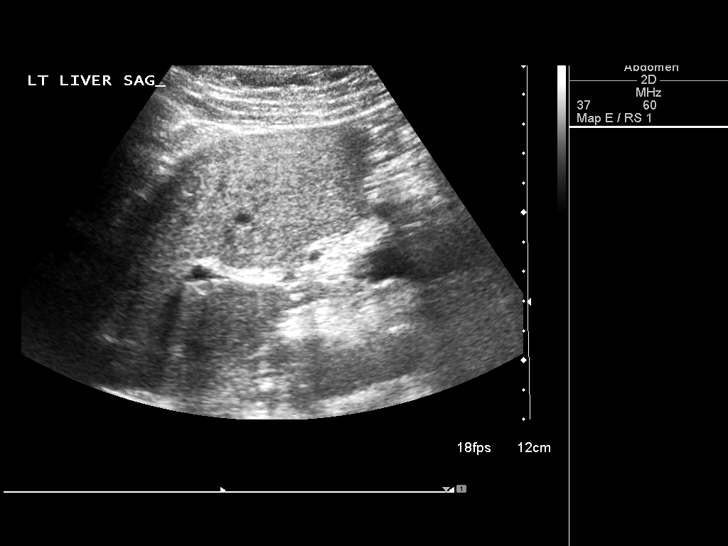
[im 5/49]
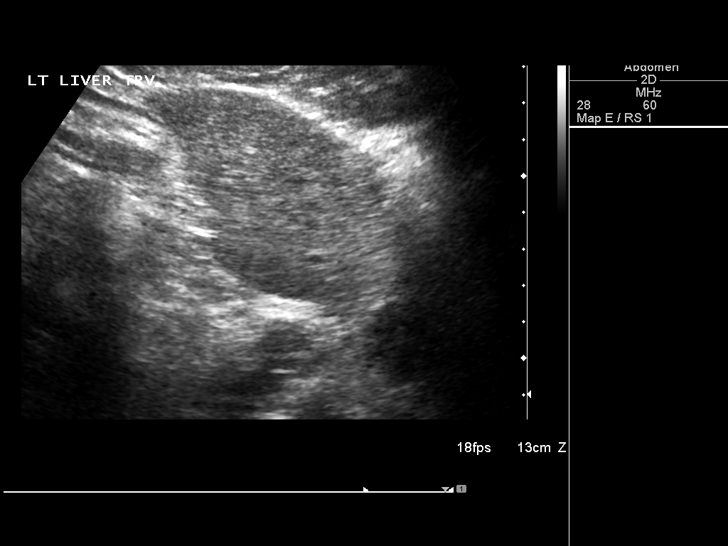
[im 9/49]
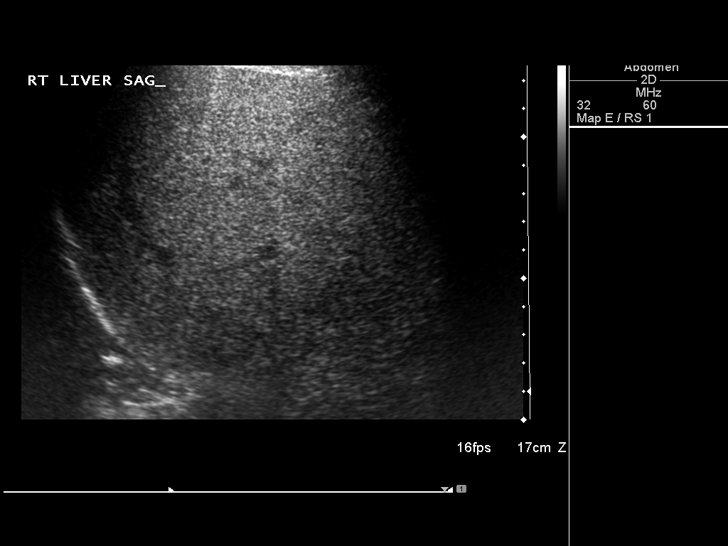
[im 13/49]
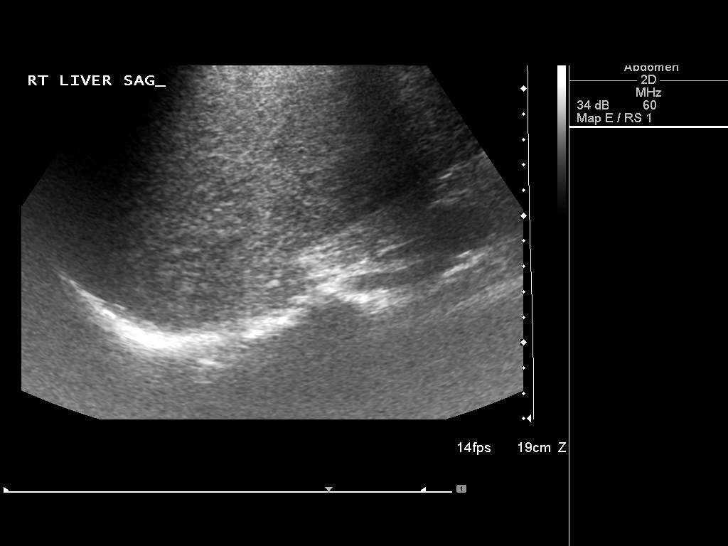
[im 17/49]
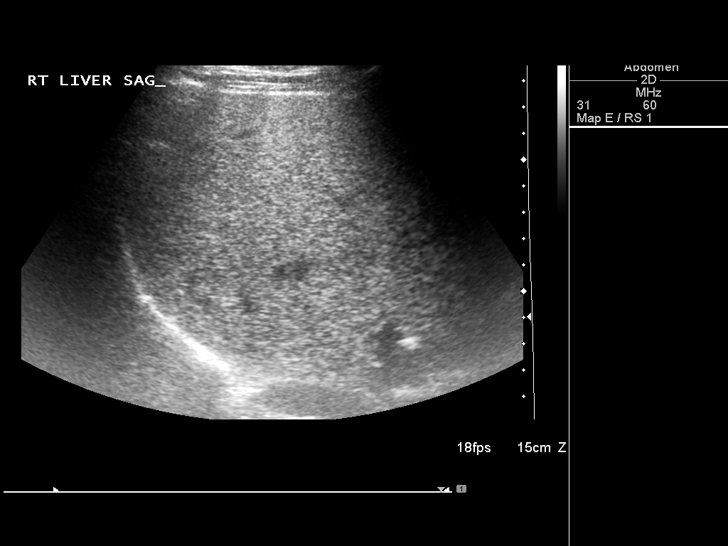
[im 19/49]
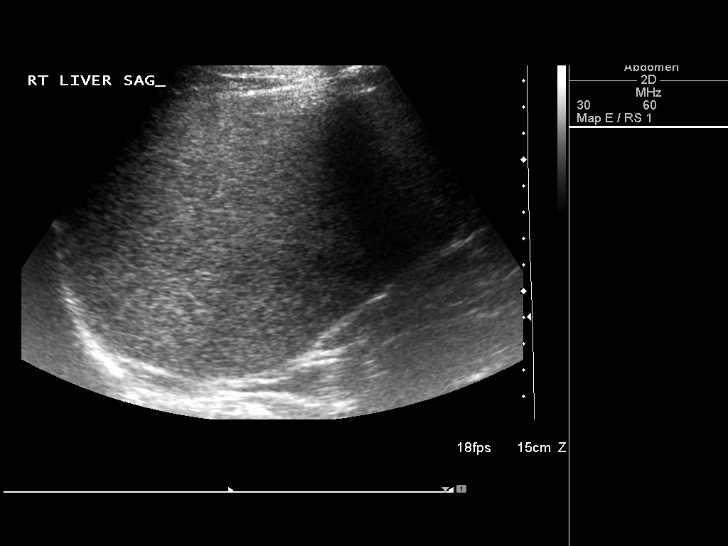
[im 23/49]
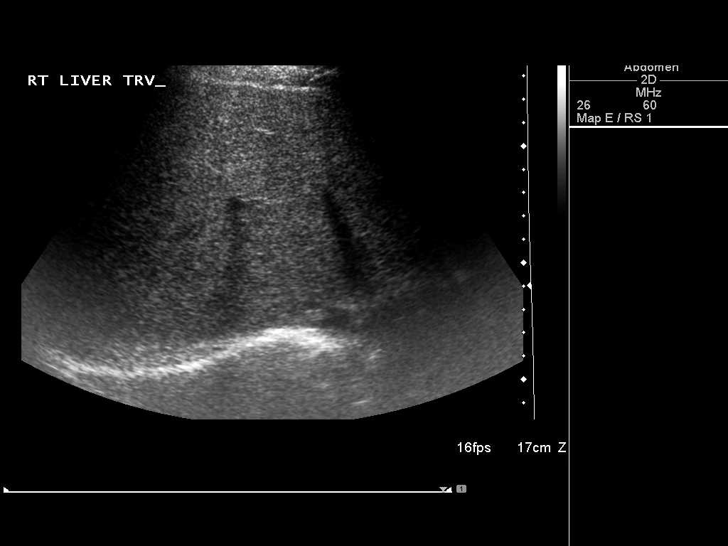
[im 27/49]
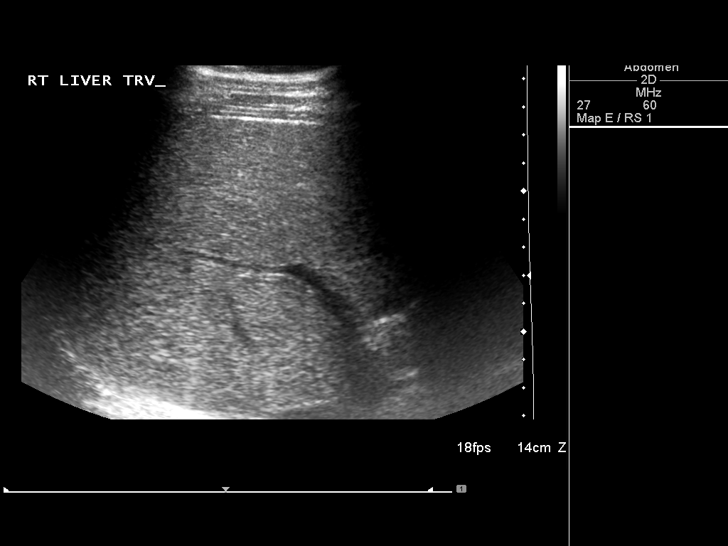
[im 31/49]
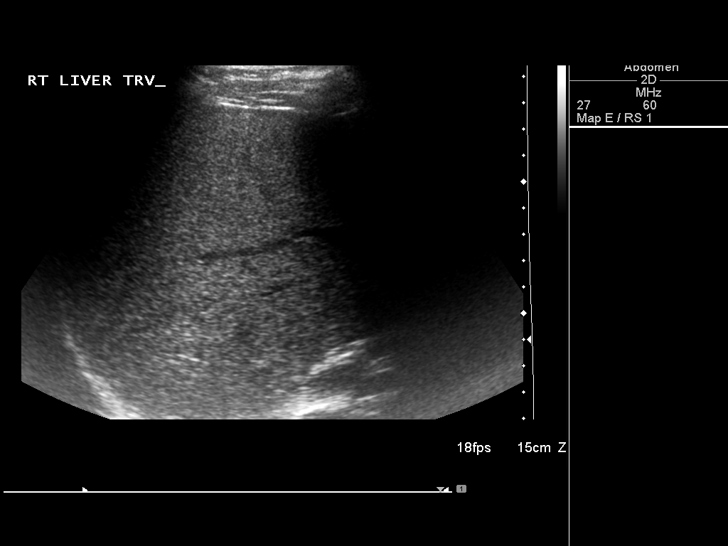
[im 33/49]
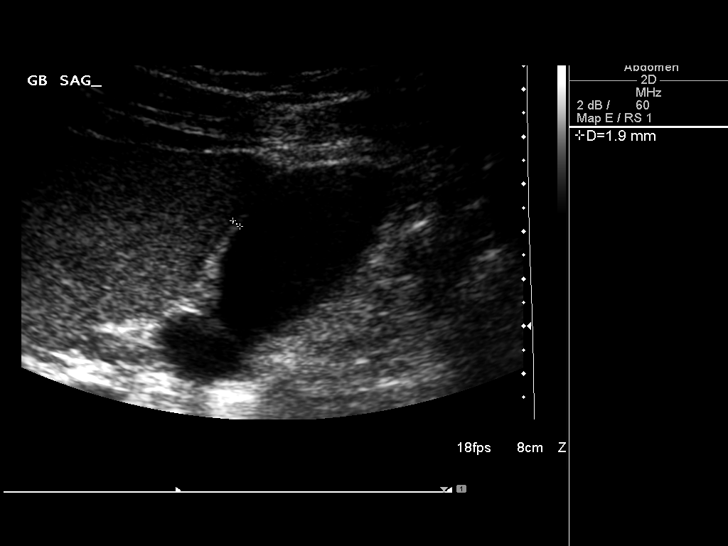
[im 37/49]
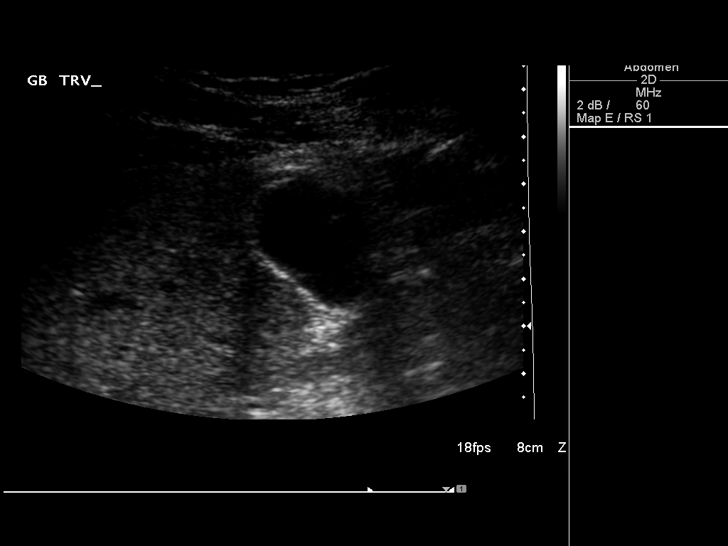
[im 41/49]
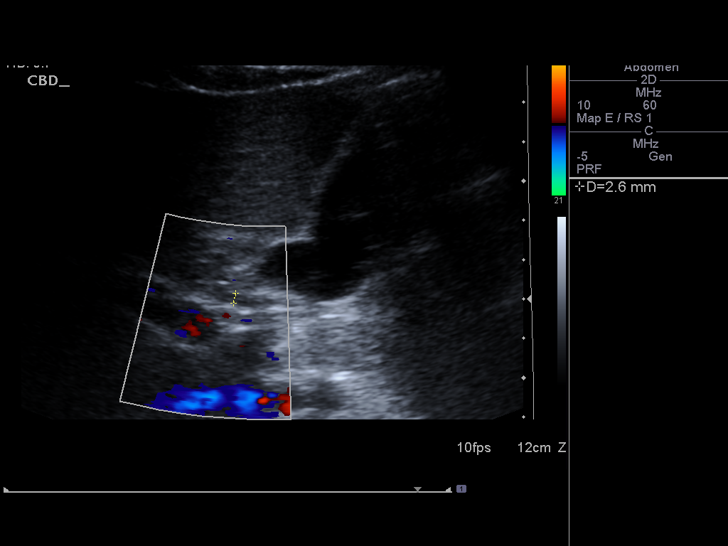
[im 45/49]
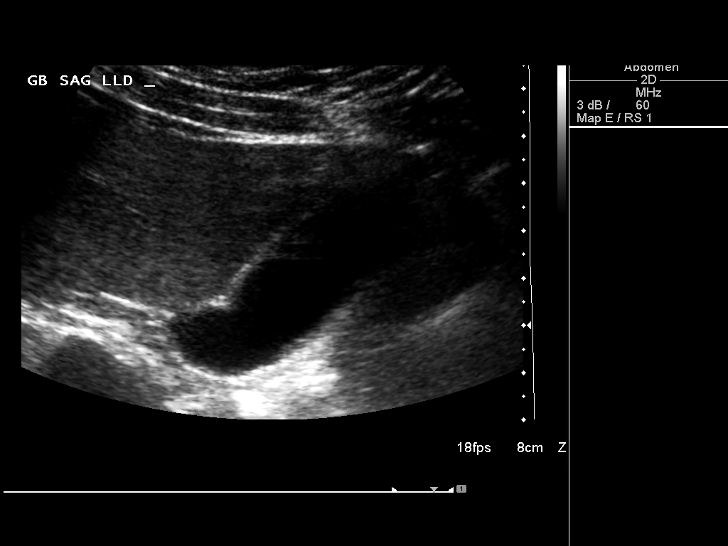
[im 49/49]
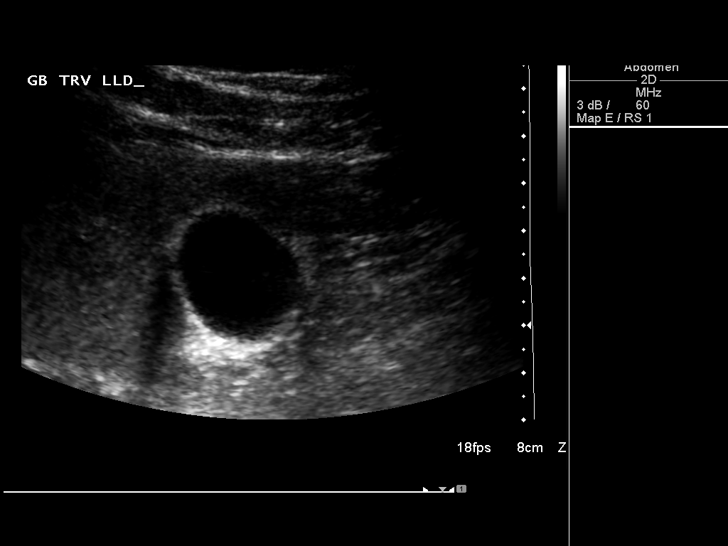

[14 of 25 positions shown; findings below may reference images not displayed]

FINDINGS: Gallbladder:

No gallstones or wall thickening visualized. No sonographic Murphy
sign noted by sonographer.

Common bile duct:

Diameter: 2.6 mm in diameter within normal limits

Liver:

No focal hepatic mass. There is diffuse coarse increased
echogenicity of the liver probable to chronic hepatitis.
IMPRESSION: No focal hepatic mass. No gallstones are noted within gallbladder.
Again noted diffuse increased echogenicity of the liver probable
fatty infiltration or chronic hepatitis.

## 2017-03-16 ENCOUNTER — Ambulatory Visit (INDEPENDENT_AMBULATORY_CARE_PROVIDER_SITE_OTHER): Payer: Commercial Managed Care - HMO | Admitting: Ophthalmology

## 2017-04-07 DIAGNOSIS — I10 Essential (primary) hypertension: Secondary | ICD-10-CM | POA: Diagnosis not present

## 2017-04-07 DIAGNOSIS — E089 Diabetes mellitus due to underlying condition without complications: Secondary | ICD-10-CM | POA: Diagnosis not present

## 2017-04-07 DIAGNOSIS — E782 Mixed hyperlipidemia: Secondary | ICD-10-CM | POA: Diagnosis not present

## 2017-04-09 DIAGNOSIS — E0800 Diabetes mellitus due to underlying condition with hyperosmolarity without nonketotic hyperglycemic-hyperosmolar coma (NKHHC): Secondary | ICD-10-CM | POA: Diagnosis not present

## 2017-04-09 DIAGNOSIS — I10 Essential (primary) hypertension: Secondary | ICD-10-CM | POA: Diagnosis not present

## 2017-08-11 DIAGNOSIS — E089 Diabetes mellitus due to underlying condition without complications: Secondary | ICD-10-CM | POA: Diagnosis not present

## 2017-08-11 DIAGNOSIS — I10 Essential (primary) hypertension: Secondary | ICD-10-CM | POA: Diagnosis not present

## 2017-08-11 DIAGNOSIS — E782 Mixed hyperlipidemia: Secondary | ICD-10-CM | POA: Diagnosis not present

## 2017-08-14 DIAGNOSIS — I10 Essential (primary) hypertension: Secondary | ICD-10-CM | POA: Diagnosis not present

## 2017-08-14 DIAGNOSIS — E782 Mixed hyperlipidemia: Secondary | ICD-10-CM | POA: Diagnosis not present

## 2017-08-14 DIAGNOSIS — E1021 Type 1 diabetes mellitus with diabetic nephropathy: Secondary | ICD-10-CM | POA: Diagnosis not present

## 2017-11-02 ENCOUNTER — Encounter (INDEPENDENT_AMBULATORY_CARE_PROVIDER_SITE_OTHER): Payer: Commercial Managed Care - HMO | Admitting: Ophthalmology

## 2017-12-08 DIAGNOSIS — I10 Essential (primary) hypertension: Secondary | ICD-10-CM | POA: Diagnosis not present

## 2017-12-08 DIAGNOSIS — B182 Chronic viral hepatitis C: Secondary | ICD-10-CM | POA: Diagnosis not present

## 2017-12-08 DIAGNOSIS — E782 Mixed hyperlipidemia: Secondary | ICD-10-CM | POA: Diagnosis not present

## 2017-12-08 DIAGNOSIS — E1021 Type 1 diabetes mellitus with diabetic nephropathy: Secondary | ICD-10-CM | POA: Diagnosis not present

## 2017-12-10 DIAGNOSIS — Z6824 Body mass index (BMI) 24.0-24.9, adult: Secondary | ICD-10-CM | POA: Diagnosis not present

## 2017-12-10 DIAGNOSIS — E782 Mixed hyperlipidemia: Secondary | ICD-10-CM | POA: Diagnosis not present

## 2017-12-10 DIAGNOSIS — I10 Essential (primary) hypertension: Secondary | ICD-10-CM | POA: Diagnosis not present

## 2017-12-10 DIAGNOSIS — E1021 Type 1 diabetes mellitus with diabetic nephropathy: Secondary | ICD-10-CM | POA: Diagnosis not present

## 2018-03-27 IMAGING — US US ABDOMEN COMPLETE
1 series · 14 of 25 positions shown · non-contrast
Comparison: 02/28/2016

CLINICAL DATA: Cirrhosis.  Surveillance.

EXAM:
ABDOMEN ULTRASOUND COMPLETE

[Series 1: us abdomen complete · 0.15mm/px · 14 of 83 slices shown]
[im 1/83]
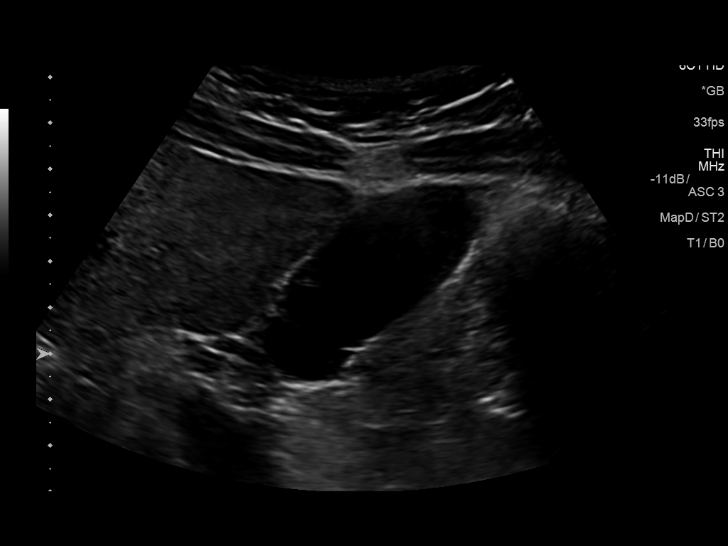
[im 7/83]
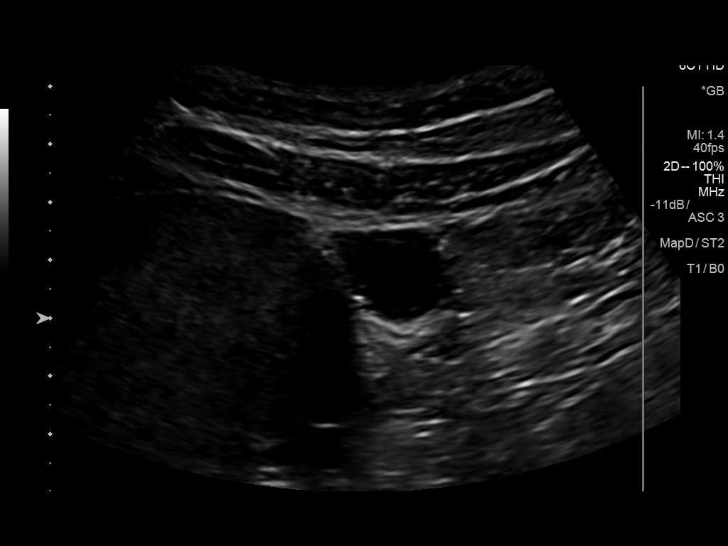
[im 14/83]
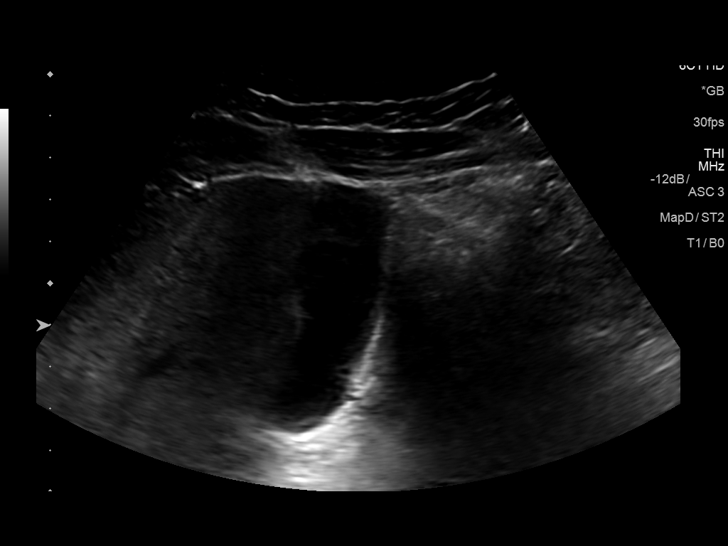
[im 21/83]
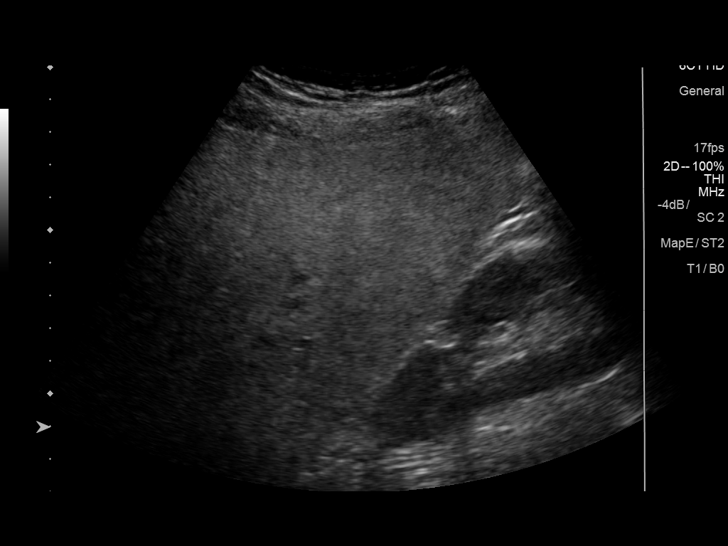
[im 28/83]
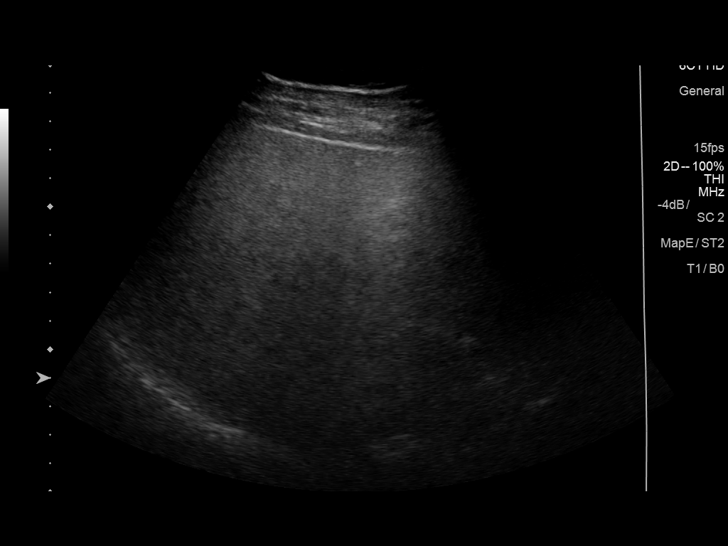
[im 31/83]
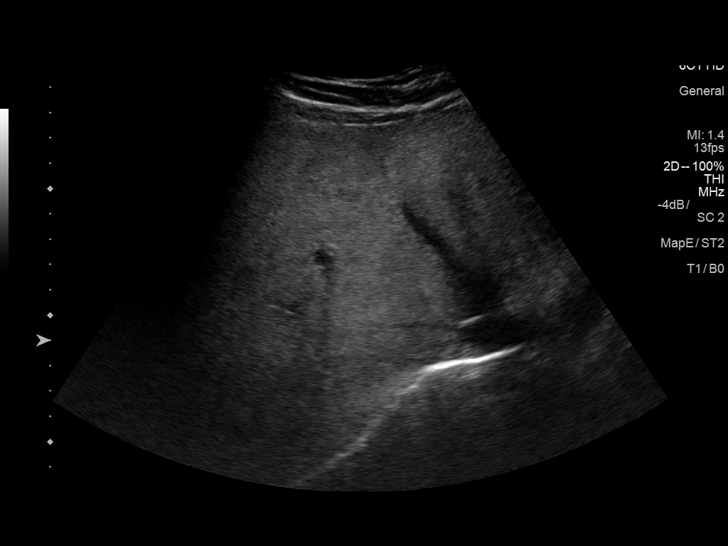
[im 38/83]
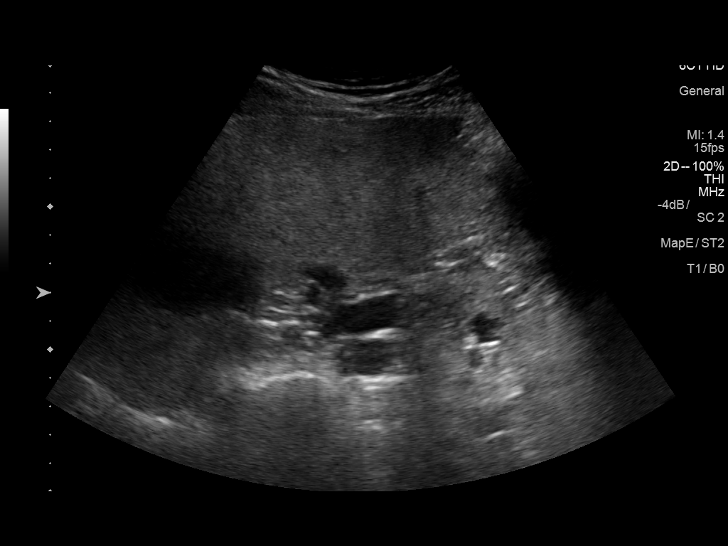
[im 45/83]
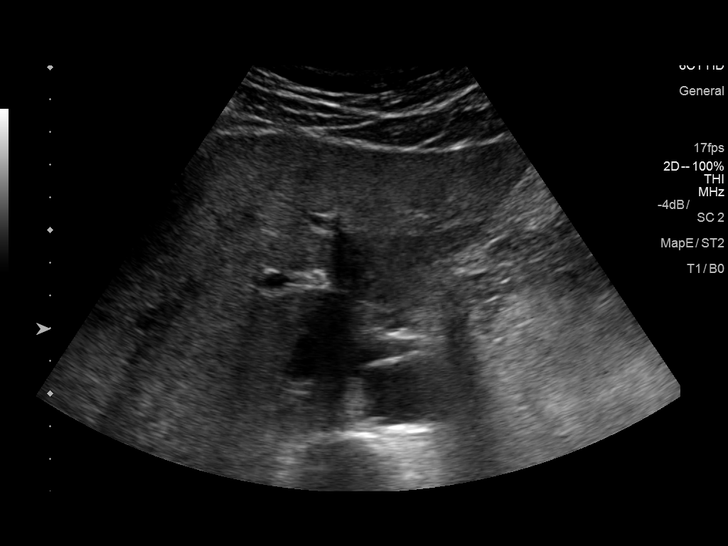
[im 52/83]
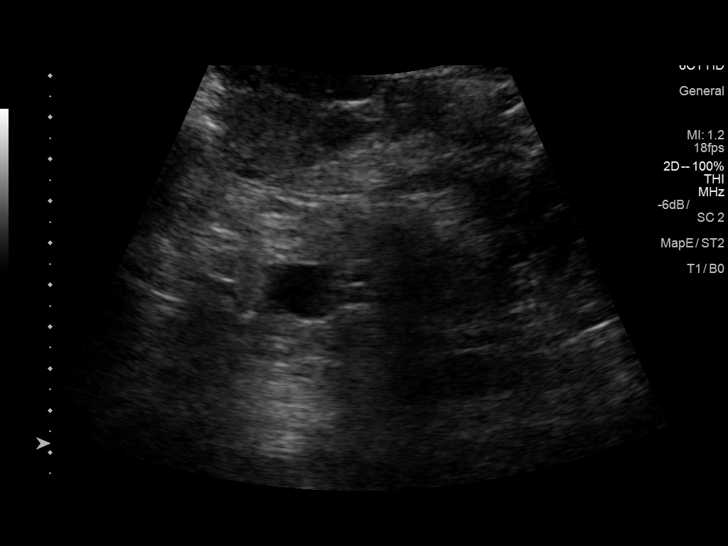
[im 55/83]
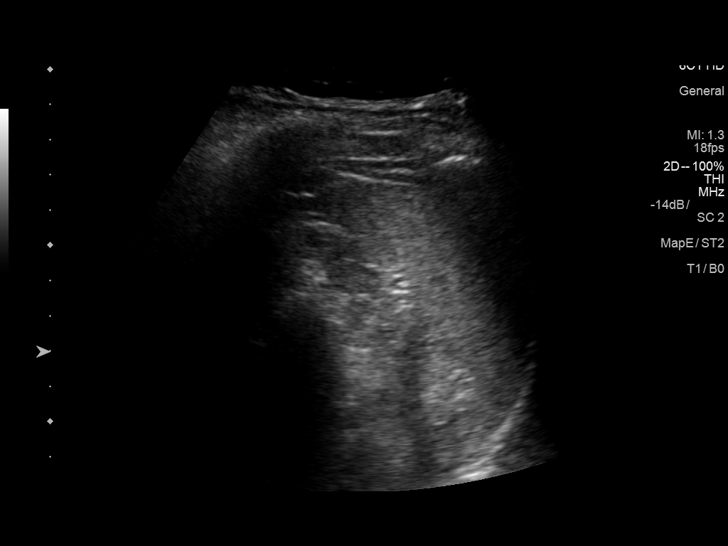
[im 62/83]
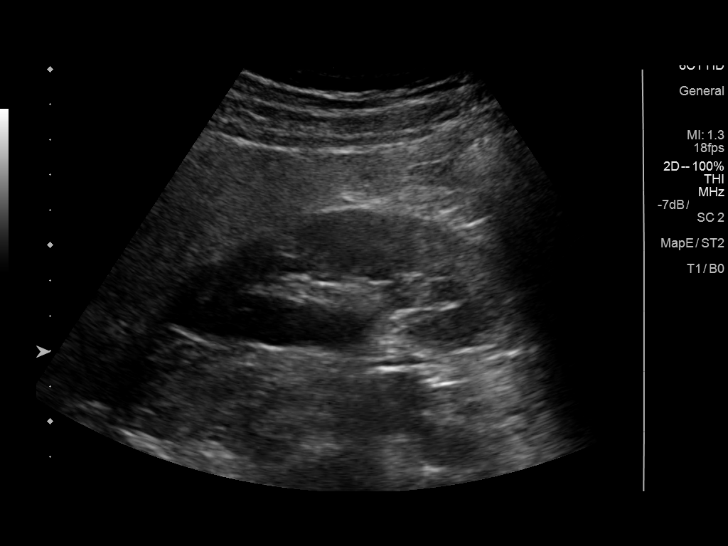
[im 69/83]
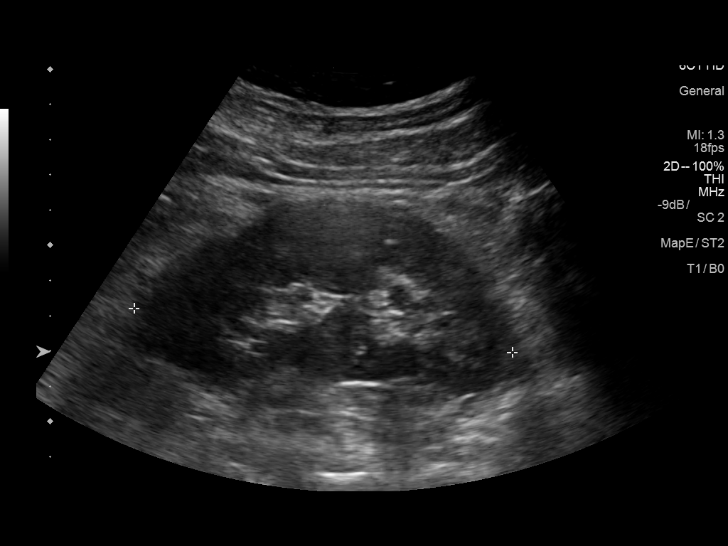
[im 76/83]
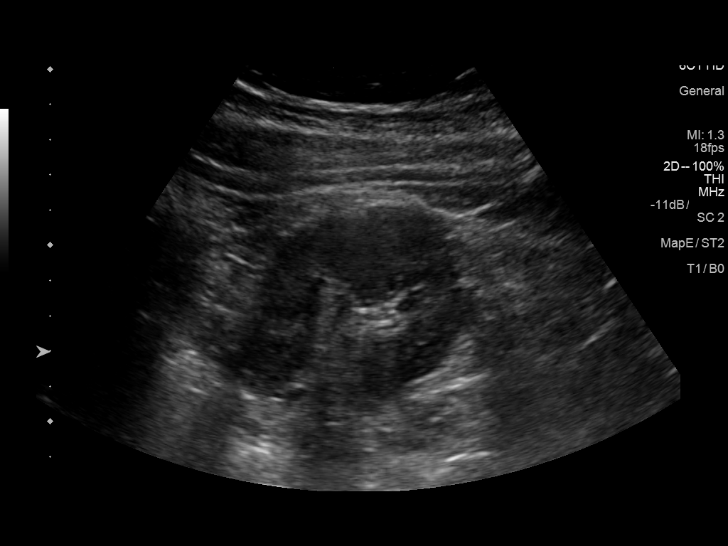
[im 83/83]
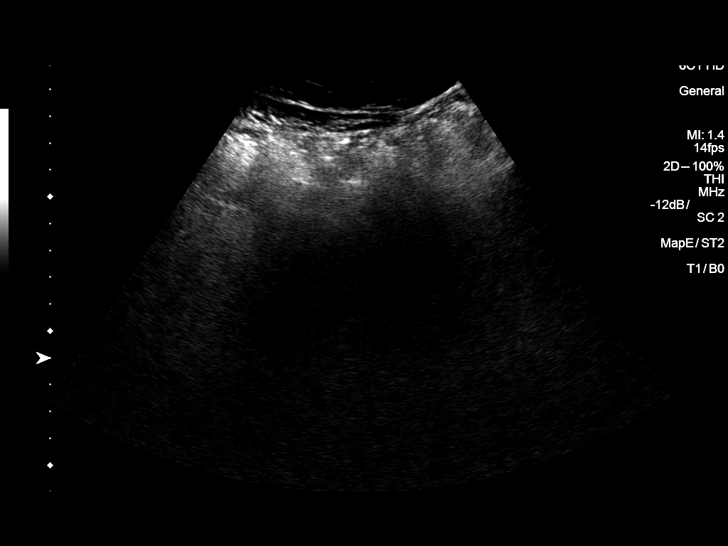

[14 of 25 positions shown; findings below may reference images not displayed]

FINDINGS: Gallbladder: No gallstones or wall thickening visualized. No
sonographic Murphy sign noted by sonographer.

Common bile duct: Diameter: 4 mm

Liver: Heterogeneous, echogenic and lobulated liver in the setting
of known cirrhosis. When accounting for intermittent artifactual/rib
shadow, there is no evidence of mass lesion. Antegrade flow in the
main portal vein.

IVC: No abnormality visualized.

Pancreas: Visualized portion unremarkable.

Spleen: Size and appearance within normal limits.

Right Kidney: Length: 11 cm. Echogenicity within normal limits. No
mass or hydronephrosis visualized.

Left Kidney: Length: 12 cm. Echogenicity within normal limits. No
mass or hydronephrosis visualized.

Abdominal aorta: No aneurysm visualized.
IMPRESSION: 1. Cirrhotic liver without evidence of mass.
2. Patent and antegrade flow in the main portal vein. No
splenomegaly.

## 2018-04-12 DIAGNOSIS — N5201 Erectile dysfunction due to arterial insufficiency: Secondary | ICD-10-CM | POA: Diagnosis not present

## 2018-04-12 DIAGNOSIS — E1021 Type 1 diabetes mellitus with diabetic nephropathy: Secondary | ICD-10-CM | POA: Diagnosis not present

## 2018-04-12 DIAGNOSIS — E782 Mixed hyperlipidemia: Secondary | ICD-10-CM | POA: Diagnosis not present

## 2018-04-12 DIAGNOSIS — I10 Essential (primary) hypertension: Secondary | ICD-10-CM | POA: Diagnosis not present

## 2018-04-13 DIAGNOSIS — I10 Essential (primary) hypertension: Secondary | ICD-10-CM | POA: Diagnosis not present

## 2018-04-13 DIAGNOSIS — E782 Mixed hyperlipidemia: Secondary | ICD-10-CM | POA: Diagnosis not present

## 2018-04-13 DIAGNOSIS — E089 Diabetes mellitus due to underlying condition without complications: Secondary | ICD-10-CM | POA: Diagnosis not present

## 2018-04-13 DIAGNOSIS — Z6825 Body mass index (BMI) 25.0-25.9, adult: Secondary | ICD-10-CM | POA: Diagnosis not present

## 2018-08-12 DIAGNOSIS — I10 Essential (primary) hypertension: Secondary | ICD-10-CM | POA: Diagnosis not present

## 2018-08-12 DIAGNOSIS — E1169 Type 2 diabetes mellitus with other specified complication: Secondary | ICD-10-CM | POA: Diagnosis not present

## 2018-08-12 DIAGNOSIS — E782 Mixed hyperlipidemia: Secondary | ICD-10-CM | POA: Diagnosis not present

## 2018-11-11 DIAGNOSIS — I10 Essential (primary) hypertension: Secondary | ICD-10-CM | POA: Diagnosis not present

## 2018-11-11 DIAGNOSIS — R7309 Other abnormal glucose: Secondary | ICD-10-CM | POA: Diagnosis not present

## 2018-11-11 DIAGNOSIS — E782 Mixed hyperlipidemia: Secondary | ICD-10-CM | POA: Diagnosis not present

## 2018-11-16 DIAGNOSIS — E1169 Type 2 diabetes mellitus with other specified complication: Secondary | ICD-10-CM | POA: Diagnosis not present

## 2018-11-16 DIAGNOSIS — I10 Essential (primary) hypertension: Secondary | ICD-10-CM | POA: Diagnosis not present

## 2018-11-16 DIAGNOSIS — E782 Mixed hyperlipidemia: Secondary | ICD-10-CM | POA: Diagnosis not present

## 2019-03-10 DIAGNOSIS — I10 Essential (primary) hypertension: Secondary | ICD-10-CM | POA: Diagnosis not present

## 2019-03-10 DIAGNOSIS — E782 Mixed hyperlipidemia: Secondary | ICD-10-CM | POA: Diagnosis not present

## 2019-03-10 DIAGNOSIS — E1169 Type 2 diabetes mellitus with other specified complication: Secondary | ICD-10-CM | POA: Diagnosis not present

## 2019-03-15 DIAGNOSIS — I10 Essential (primary) hypertension: Secondary | ICD-10-CM | POA: Diagnosis not present

## 2019-03-15 DIAGNOSIS — E782 Mixed hyperlipidemia: Secondary | ICD-10-CM | POA: Diagnosis not present

## 2019-03-15 DIAGNOSIS — E1169 Type 2 diabetes mellitus with other specified complication: Secondary | ICD-10-CM | POA: Diagnosis not present

## 2019-03-15 DIAGNOSIS — F064 Anxiety disorder due to known physiological condition: Secondary | ICD-10-CM | POA: Diagnosis not present

## 2019-05-03 DIAGNOSIS — F064 Anxiety disorder due to known physiological condition: Secondary | ICD-10-CM | POA: Diagnosis not present

## 2019-05-03 DIAGNOSIS — H5450 Low vision, one eye, unspecified eye: Secondary | ICD-10-CM | POA: Diagnosis not present

## 2019-05-03 DIAGNOSIS — I10 Essential (primary) hypertension: Secondary | ICD-10-CM | POA: Diagnosis not present

## 2019-08-01 DIAGNOSIS — F064 Anxiety disorder due to known physiological condition: Secondary | ICD-10-CM | POA: Diagnosis not present

## 2019-08-01 DIAGNOSIS — I10 Essential (primary) hypertension: Secondary | ICD-10-CM | POA: Diagnosis not present

## 2019-08-01 DIAGNOSIS — E1169 Type 2 diabetes mellitus with other specified complication: Secondary | ICD-10-CM | POA: Diagnosis not present

## 2019-08-01 DIAGNOSIS — E782 Mixed hyperlipidemia: Secondary | ICD-10-CM | POA: Diagnosis not present

## 2019-08-03 DIAGNOSIS — Z7141 Alcohol abuse counseling and surveillance of alcoholic: Secondary | ICD-10-CM | POA: Diagnosis not present

## 2019-08-03 DIAGNOSIS — E782 Mixed hyperlipidemia: Secondary | ICD-10-CM | POA: Diagnosis not present

## 2019-08-03 DIAGNOSIS — I10 Essential (primary) hypertension: Secondary | ICD-10-CM | POA: Diagnosis not present

## 2019-08-03 DIAGNOSIS — N5201 Erectile dysfunction due to arterial insufficiency: Secondary | ICD-10-CM | POA: Diagnosis not present

## 2019-08-03 DIAGNOSIS — F064 Anxiety disorder due to known physiological condition: Secondary | ICD-10-CM | POA: Diagnosis not present

## 2019-12-02 DIAGNOSIS — I10 Essential (primary) hypertension: Secondary | ICD-10-CM | POA: Diagnosis not present

## 2019-12-02 DIAGNOSIS — E119 Type 2 diabetes mellitus without complications: Secondary | ICD-10-CM | POA: Diagnosis not present

## 2019-12-02 DIAGNOSIS — N5201 Erectile dysfunction due to arterial insufficiency: Secondary | ICD-10-CM | POA: Diagnosis not present

## 2019-12-02 DIAGNOSIS — F064 Anxiety disorder due to known physiological condition: Secondary | ICD-10-CM | POA: Diagnosis not present

## 2019-12-02 DIAGNOSIS — E782 Mixed hyperlipidemia: Secondary | ICD-10-CM | POA: Diagnosis not present

## 2019-12-07 DIAGNOSIS — E1169 Type 2 diabetes mellitus with other specified complication: Secondary | ICD-10-CM | POA: Diagnosis not present

## 2019-12-07 DIAGNOSIS — F064 Anxiety disorder due to known physiological condition: Secondary | ICD-10-CM | POA: Diagnosis not present

## 2019-12-07 DIAGNOSIS — I10 Essential (primary) hypertension: Secondary | ICD-10-CM | POA: Diagnosis not present

## 2020-04-09 DIAGNOSIS — I10 Essential (primary) hypertension: Secondary | ICD-10-CM | POA: Diagnosis not present

## 2020-04-09 DIAGNOSIS — E1169 Type 2 diabetes mellitus with other specified complication: Secondary | ICD-10-CM | POA: Diagnosis not present

## 2020-04-12 DIAGNOSIS — E782 Mixed hyperlipidemia: Secondary | ICD-10-CM | POA: Diagnosis not present

## 2020-04-12 DIAGNOSIS — E1169 Type 2 diabetes mellitus with other specified complication: Secondary | ICD-10-CM | POA: Diagnosis not present

## 2020-04-12 DIAGNOSIS — H5450 Low vision, one eye, unspecified eye: Secondary | ICD-10-CM | POA: Diagnosis not present

## 2020-04-12 DIAGNOSIS — E1021 Type 1 diabetes mellitus with diabetic nephropathy: Secondary | ICD-10-CM | POA: Diagnosis not present

## 2020-04-12 DIAGNOSIS — I10 Essential (primary) hypertension: Secondary | ICD-10-CM | POA: Diagnosis not present

## 2020-08-13 DIAGNOSIS — E1169 Type 2 diabetes mellitus with other specified complication: Secondary | ICD-10-CM | POA: Diagnosis not present

## 2020-08-13 DIAGNOSIS — F064 Anxiety disorder due to known physiological condition: Secondary | ICD-10-CM | POA: Diagnosis not present

## 2020-08-13 DIAGNOSIS — I10 Essential (primary) hypertension: Secondary | ICD-10-CM | POA: Diagnosis not present

## 2020-08-24 DIAGNOSIS — H5450 Low vision, one eye, unspecified eye: Secondary | ICD-10-CM | POA: Diagnosis not present

## 2020-08-24 DIAGNOSIS — B182 Chronic viral hepatitis C: Secondary | ICD-10-CM | POA: Diagnosis not present

## 2020-08-24 DIAGNOSIS — E1169 Type 2 diabetes mellitus with other specified complication: Secondary | ICD-10-CM | POA: Diagnosis not present

## 2020-08-24 DIAGNOSIS — I10 Essential (primary) hypertension: Secondary | ICD-10-CM | POA: Diagnosis not present

## 2020-10-08 DIAGNOSIS — F1021 Alcohol dependence, in remission: Secondary | ICD-10-CM | POA: Diagnosis not present

## 2020-10-08 DIAGNOSIS — E782 Mixed hyperlipidemia: Secondary | ICD-10-CM | POA: Diagnosis not present

## 2020-10-08 DIAGNOSIS — E1169 Type 2 diabetes mellitus with other specified complication: Secondary | ICD-10-CM | POA: Diagnosis not present

## 2020-10-08 DIAGNOSIS — I10 Essential (primary) hypertension: Secondary | ICD-10-CM | POA: Diagnosis not present

## 2020-10-08 DIAGNOSIS — F064 Anxiety disorder due to known physiological condition: Secondary | ICD-10-CM | POA: Diagnosis not present

## 2021-01-15 DIAGNOSIS — E1169 Type 2 diabetes mellitus with other specified complication: Secondary | ICD-10-CM | POA: Diagnosis not present

## 2021-01-15 DIAGNOSIS — I10 Essential (primary) hypertension: Secondary | ICD-10-CM | POA: Diagnosis not present

## 2021-01-15 DIAGNOSIS — E782 Mixed hyperlipidemia: Secondary | ICD-10-CM | POA: Diagnosis not present

## 2021-01-15 DIAGNOSIS — F064 Anxiety disorder due to known physiological condition: Secondary | ICD-10-CM | POA: Diagnosis not present

## 2021-01-23 DIAGNOSIS — F1021 Alcohol dependence, in remission: Secondary | ICD-10-CM | POA: Diagnosis not present

## 2021-01-23 DIAGNOSIS — F064 Anxiety disorder due to known physiological condition: Secondary | ICD-10-CM | POA: Diagnosis not present

## 2021-01-23 DIAGNOSIS — H5450 Low vision, one eye, unspecified eye: Secondary | ICD-10-CM | POA: Diagnosis not present

## 2021-01-23 DIAGNOSIS — Z0001 Encounter for general adult medical examination with abnormal findings: Secondary | ICD-10-CM | POA: Diagnosis not present

## 2021-01-23 DIAGNOSIS — Z23 Encounter for immunization: Secondary | ICD-10-CM | POA: Diagnosis not present

## 2021-01-23 DIAGNOSIS — F333 Major depressive disorder, recurrent, severe with psychotic symptoms: Secondary | ICD-10-CM | POA: Diagnosis not present

## 2021-01-23 DIAGNOSIS — E1169 Type 2 diabetes mellitus with other specified complication: Secondary | ICD-10-CM | POA: Diagnosis not present

## 2021-02-13 DIAGNOSIS — E1169 Type 2 diabetes mellitus with other specified complication: Secondary | ICD-10-CM | POA: Diagnosis not present

## 2021-02-13 DIAGNOSIS — I10 Essential (primary) hypertension: Secondary | ICD-10-CM | POA: Diagnosis not present

## 2021-03-13 ENCOUNTER — Other Ambulatory Visit: Payer: Self-pay

## 2021-03-13 ENCOUNTER — Encounter (INDEPENDENT_AMBULATORY_CARE_PROVIDER_SITE_OTHER): Payer: Medicare HMO | Admitting: Ophthalmology

## 2021-03-13 DIAGNOSIS — I10 Essential (primary) hypertension: Secondary | ICD-10-CM | POA: Diagnosis not present

## 2021-03-13 DIAGNOSIS — H35033 Hypertensive retinopathy, bilateral: Secondary | ICD-10-CM

## 2021-03-13 DIAGNOSIS — F333 Major depressive disorder, recurrent, severe with psychotic symptoms: Secondary | ICD-10-CM | POA: Diagnosis not present

## 2021-03-13 DIAGNOSIS — H3411 Central retinal artery occlusion, right eye: Secondary | ICD-10-CM

## 2021-03-13 DIAGNOSIS — H348112 Central retinal vein occlusion, right eye, stable: Secondary | ICD-10-CM | POA: Diagnosis not present

## 2021-03-13 DIAGNOSIS — H43813 Vitreous degeneration, bilateral: Secondary | ICD-10-CM

## 2021-03-13 DIAGNOSIS — H2513 Age-related nuclear cataract, bilateral: Secondary | ICD-10-CM | POA: Diagnosis not present

## 2021-05-08 DIAGNOSIS — E782 Mixed hyperlipidemia: Secondary | ICD-10-CM | POA: Diagnosis not present

## 2021-05-08 DIAGNOSIS — I10 Essential (primary) hypertension: Secondary | ICD-10-CM | POA: Diagnosis not present

## 2021-05-08 DIAGNOSIS — E1169 Type 2 diabetes mellitus with other specified complication: Secondary | ICD-10-CM | POA: Diagnosis not present

## 2021-05-16 DIAGNOSIS — H5450 Low vision, one eye, unspecified eye: Secondary | ICD-10-CM | POA: Diagnosis not present

## 2021-05-16 DIAGNOSIS — E782 Mixed hyperlipidemia: Secondary | ICD-10-CM | POA: Diagnosis not present

## 2021-05-16 DIAGNOSIS — I10 Essential (primary) hypertension: Secondary | ICD-10-CM | POA: Diagnosis not present

## 2021-05-16 DIAGNOSIS — E1169 Type 2 diabetes mellitus with other specified complication: Secondary | ICD-10-CM | POA: Diagnosis not present

## 2021-05-30 ENCOUNTER — Encounter: Payer: Self-pay | Admitting: Gastroenterology

## 2021-09-05 DIAGNOSIS — B182 Chronic viral hepatitis C: Secondary | ICD-10-CM | POA: Diagnosis not present

## 2021-09-05 DIAGNOSIS — E782 Mixed hyperlipidemia: Secondary | ICD-10-CM | POA: Diagnosis not present

## 2021-09-05 DIAGNOSIS — F064 Anxiety disorder due to known physiological condition: Secondary | ICD-10-CM | POA: Diagnosis not present

## 2021-09-05 DIAGNOSIS — E1169 Type 2 diabetes mellitus with other specified complication: Secondary | ICD-10-CM | POA: Diagnosis not present

## 2021-09-05 DIAGNOSIS — H5450 Low vision, one eye, unspecified eye: Secondary | ICD-10-CM | POA: Diagnosis not present

## 2021-09-05 DIAGNOSIS — N5201 Erectile dysfunction due to arterial insufficiency: Secondary | ICD-10-CM | POA: Diagnosis not present

## 2021-09-05 DIAGNOSIS — E1021 Type 1 diabetes mellitus with diabetic nephropathy: Secondary | ICD-10-CM | POA: Diagnosis not present

## 2021-09-11 DIAGNOSIS — E782 Mixed hyperlipidemia: Secondary | ICD-10-CM | POA: Diagnosis not present

## 2021-09-11 DIAGNOSIS — E0800 Diabetes mellitus due to underlying condition with hyperosmolarity without nonketotic hyperglycemic-hyperosmolar coma (NKHHC): Secondary | ICD-10-CM | POA: Diagnosis not present

## 2021-09-11 DIAGNOSIS — I10 Essential (primary) hypertension: Secondary | ICD-10-CM | POA: Diagnosis not present

## 2021-09-11 DIAGNOSIS — E1169 Type 2 diabetes mellitus with other specified complication: Secondary | ICD-10-CM | POA: Diagnosis not present

## 2021-12-11 DIAGNOSIS — E785 Hyperlipidemia, unspecified: Secondary | ICD-10-CM | POA: Diagnosis not present

## 2021-12-11 DIAGNOSIS — Z0000 Encounter for general adult medical examination without abnormal findings: Secondary | ICD-10-CM | POA: Diagnosis not present

## 2021-12-11 DIAGNOSIS — R635 Abnormal weight gain: Secondary | ICD-10-CM | POA: Diagnosis not present

## 2021-12-11 DIAGNOSIS — I10 Essential (primary) hypertension: Secondary | ICD-10-CM | POA: Diagnosis not present

## 2021-12-11 DIAGNOSIS — F17200 Nicotine dependence, unspecified, uncomplicated: Secondary | ICD-10-CM | POA: Diagnosis not present

## 2021-12-24 DIAGNOSIS — Z1159 Encounter for screening for other viral diseases: Secondary | ICD-10-CM | POA: Diagnosis not present

## 2021-12-24 DIAGNOSIS — E559 Vitamin D deficiency, unspecified: Secondary | ICD-10-CM | POA: Diagnosis not present

## 2021-12-24 DIAGNOSIS — Z125 Encounter for screening for malignant neoplasm of prostate: Secondary | ICD-10-CM | POA: Diagnosis not present

## 2021-12-24 DIAGNOSIS — E785 Hyperlipidemia, unspecified: Secondary | ICD-10-CM | POA: Diagnosis not present

## 2021-12-24 DIAGNOSIS — I10 Essential (primary) hypertension: Secondary | ICD-10-CM | POA: Diagnosis not present

## 2021-12-24 DIAGNOSIS — Z79899 Other long term (current) drug therapy: Secondary | ICD-10-CM | POA: Diagnosis not present

## 2021-12-24 DIAGNOSIS — Z0001 Encounter for general adult medical examination with abnormal findings: Secondary | ICD-10-CM | POA: Diagnosis not present

## 2021-12-24 DIAGNOSIS — E1142 Type 2 diabetes mellitus with diabetic polyneuropathy: Secondary | ICD-10-CM | POA: Diagnosis not present

## 2021-12-24 DIAGNOSIS — Z7189 Other specified counseling: Secondary | ICD-10-CM | POA: Diagnosis not present

## 2021-12-24 DIAGNOSIS — Z0000 Encounter for general adult medical examination without abnormal findings: Secondary | ICD-10-CM | POA: Diagnosis not present

## 2022-03-14 ENCOUNTER — Encounter (INDEPENDENT_AMBULATORY_CARE_PROVIDER_SITE_OTHER): Payer: Medicare HMO | Admitting: Ophthalmology

## 2022-06-05 LAB — COLOGUARD: COLOGUARD: POSITIVE — AB

## 2024-02-15 ENCOUNTER — Other Ambulatory Visit: Payer: Self-pay | Admitting: Nurse Practitioner

## 2024-02-15 DIAGNOSIS — K7469 Other cirrhosis of liver: Secondary | ICD-10-CM

## 2024-02-23 ENCOUNTER — Ambulatory Visit
Admission: RE | Admit: 2024-02-23 | Discharge: 2024-02-23 | Disposition: A | Source: Ambulatory Visit | Attending: Nurse Practitioner

## 2024-02-23 DIAGNOSIS — K7469 Other cirrhosis of liver: Secondary | ICD-10-CM
